# Patient Record
Sex: Female | Born: 1965
Health system: Southern US, Community
[De-identification: ages and names within clinical notes are randomized; demographics above are authoritative.]

## PROBLEM LIST (undated history)

## (undated) DIAGNOSIS — I10 Essential (primary) hypertension: Secondary | ICD-10-CM

## (undated) DIAGNOSIS — T7840XA Allergy, unspecified, initial encounter: Secondary | ICD-10-CM

## (undated) DIAGNOSIS — I7300 Raynaud's syndrome without gangrene: Secondary | ICD-10-CM

## (undated) DIAGNOSIS — C44310 Basal cell carcinoma of skin of unspecified parts of face: Secondary | ICD-10-CM

## (undated) DIAGNOSIS — J342 Deviated nasal septum: Secondary | ICD-10-CM

## (undated) DIAGNOSIS — M2240 Chondromalacia patellae, unspecified knee: Secondary | ICD-10-CM

## (undated) DIAGNOSIS — J309 Allergic rhinitis, unspecified: Secondary | ICD-10-CM

## (undated) DIAGNOSIS — G4730 Sleep apnea, unspecified: Secondary | ICD-10-CM

## (undated) DIAGNOSIS — E559 Vitamin D deficiency, unspecified: Secondary | ICD-10-CM

## (undated) DIAGNOSIS — M7710 Lateral epicondylitis, unspecified elbow: Secondary | ICD-10-CM

## (undated) HISTORY — DX: Vitamin D deficiency, unspecified: E55.9

## (undated) HISTORY — DX: Deviated nasal septum: J34.2

## (undated) HISTORY — DX: Allergic rhinitis, unspecified: J30.9

## (undated) HISTORY — DX: Basal cell carcinoma of skin of unspecified parts of face: C44.310

## (undated) HISTORY — PX: OTHER SURGICAL HISTORY: SHX169

## (undated) HISTORY — DX: Allergy, unspecified, initial encounter: T78.40XA

## (undated) HISTORY — DX: Lateral epicondylitis, unspecified elbow: M77.10

## (undated) HISTORY — DX: Raynaud's syndrome without gangrene: I73.00

## (undated) HISTORY — DX: Chondromalacia patellae, unspecified knee: M22.40

## (undated) HISTORY — DX: Sleep apnea, unspecified: G47.30

---

## 1997-08-06 ENCOUNTER — Other Ambulatory Visit: Admission: RE | Admit: 1997-08-06 | Discharge: 1997-08-06 | Payer: Self-pay | Admitting: Obstetrics and Gynecology

## 1998-11-03 ENCOUNTER — Other Ambulatory Visit: Admission: RE | Admit: 1998-11-03 | Discharge: 1998-11-03 | Payer: Self-pay | Admitting: Obstetrics & Gynecology

## 1999-11-03 ENCOUNTER — Other Ambulatory Visit: Admission: RE | Admit: 1999-11-03 | Discharge: 1999-11-03 | Payer: Self-pay | Admitting: Obstetrics & Gynecology

## 2000-07-03 ENCOUNTER — Inpatient Hospital Stay (HOSPITAL_COMMUNITY): Admission: AD | Admit: 2000-07-03 | Discharge: 2000-07-06 | Payer: Self-pay | Admitting: Obstetrics & Gynecology

## 2000-08-08 ENCOUNTER — Other Ambulatory Visit: Admission: RE | Admit: 2000-08-08 | Discharge: 2000-08-08 | Payer: Self-pay | Admitting: Obstetrics & Gynecology

## 2000-12-11 ENCOUNTER — Encounter: Admission: RE | Admit: 2000-12-11 | Discharge: 2000-12-11 | Payer: Self-pay | Admitting: Obstetrics & Gynecology

## 2000-12-11 ENCOUNTER — Encounter: Payer: Self-pay | Admitting: Obstetrics & Gynecology

## 2001-04-15 ENCOUNTER — Encounter: Admission: RE | Admit: 2001-04-15 | Discharge: 2001-04-15 | Payer: Self-pay | Admitting: Obstetrics & Gynecology

## 2001-04-15 ENCOUNTER — Encounter: Payer: Self-pay | Admitting: Obstetrics & Gynecology

## 2001-10-15 ENCOUNTER — Other Ambulatory Visit: Admission: RE | Admit: 2001-10-15 | Discharge: 2001-10-15 | Payer: Self-pay | Admitting: Obstetrics & Gynecology

## 2001-11-05 ENCOUNTER — Encounter: Payer: Self-pay | Admitting: Obstetrics & Gynecology

## 2001-11-05 ENCOUNTER — Encounter: Admission: RE | Admit: 2001-11-05 | Discharge: 2001-11-05 | Payer: Self-pay | Admitting: Obstetrics & Gynecology

## 2002-07-14 ENCOUNTER — Other Ambulatory Visit: Admission: RE | Admit: 2002-07-14 | Discharge: 2002-07-14 | Payer: Self-pay | Admitting: Obstetrics & Gynecology

## 2002-11-06 ENCOUNTER — Encounter: Admission: RE | Admit: 2002-11-06 | Discharge: 2002-11-06 | Payer: Self-pay | Admitting: Obstetrics & Gynecology

## 2005-03-07 ENCOUNTER — Encounter: Admission: RE | Admit: 2005-03-07 | Discharge: 2005-03-07 | Payer: Self-pay | Admitting: Obstetrics & Gynecology

## 2006-03-19 ENCOUNTER — Encounter: Admission: RE | Admit: 2006-03-19 | Discharge: 2006-03-19 | Payer: Self-pay | Admitting: Obstetrics & Gynecology

## 2007-03-28 ENCOUNTER — Encounter: Admission: RE | Admit: 2007-03-28 | Discharge: 2007-03-28 | Payer: Self-pay | Admitting: Obstetrics & Gynecology

## 2007-09-13 ENCOUNTER — Encounter: Admission: RE | Admit: 2007-09-13 | Discharge: 2007-09-13 | Payer: Self-pay | Admitting: Obstetrics & Gynecology

## 2008-08-07 ENCOUNTER — Encounter: Admission: RE | Admit: 2008-08-07 | Discharge: 2008-08-07 | Payer: Self-pay | Admitting: Obstetrics & Gynecology

## 2009-10-12 ENCOUNTER — Encounter: Admission: RE | Admit: 2009-10-12 | Discharge: 2009-10-12 | Payer: Self-pay | Admitting: Obstetrics & Gynecology

## 2010-03-03 DIAGNOSIS — E559 Vitamin D deficiency, unspecified: Secondary | ICD-10-CM

## 2010-03-03 HISTORY — DX: Vitamin D deficiency, unspecified: E55.9

## 2010-03-17 ENCOUNTER — Ambulatory Visit: Payer: Self-pay | Admitting: Family Medicine

## 2010-03-18 ENCOUNTER — Ambulatory Visit (INDEPENDENT_AMBULATORY_CARE_PROVIDER_SITE_OTHER): Payer: BC Managed Care – PPO | Admitting: Family Medicine

## 2010-03-18 DIAGNOSIS — R5383 Other fatigue: Secondary | ICD-10-CM

## 2010-03-18 DIAGNOSIS — M79609 Pain in unspecified limb: Secondary | ICD-10-CM

## 2010-03-18 DIAGNOSIS — Z0000 Encounter for general adult medical examination without abnormal findings: Secondary | ICD-10-CM

## 2010-05-20 NOTE — H&P (Signed)
[facility]  PatientBRESHAY, ILG                        MRN: [id number] Adm. Date:  [id number] Attending:  Lenoard Aden                         History and Physical  CHIEF COMPLAINT:              LGA for induction.  HISTORY OF PRESENT ILLNESS:   A 45 year old white female G1, P0 at [redacted] weeks gestation with estimated fetal weight 9 pounds.  PAST MEDICAL HISTORY:         Noncontributory.  FAMILY HISTORY:               Noncontributory.  ALLERGIES:                    No known drug allergies.  MEDICATIONS:                  Prenatal vitamins.  PREGNANCY COURSE:             Complicated by presumed LGA, but no other complications.  PHYSICAL EXAMINATION  GENERAL:                      Well-developed, well-nourished white female in no apparent distress.  HEENT:                        Normal.  LUNGS:                        Clear.  HEART:                        Regular rate and rhythm.  ABDOMEN:                      Soft, gravid, nontender.  Estimated fetal weight by ultrasound 9.5 pounds.  PELVIC:                       Cervix:  Closed.  2..5-3 cm, long, vertex, -1/-2.  EXTREMITIES:                  No cords.  NEUROLOGIC:                   Nonfocal.  IMPRESSION:                   A 40 week intrauterine pregnancy with presumed large for gestational age.  PLAN:                         Cervical ripening and induction.  Reassuring fetal heart rate surveillance noted today.DD:  07/04/00 TD:  07/04/00 Job: [id number]

## 2010-05-20 NOTE — H&P (Signed)
[facility]  PatientMAYLENE, [name]                        MRN: [id number] Adm. Date:  [id number] Attending:  Lenoard Aden                         History and Physical  DATE OF BIRTH:                December 09, 1965  PATIENT PROFILE:              [name] is a 45 year old G1, last menstrual period September 26, 1999, for an expected date of delivery, July 03, 2000, at 40 weeks and 1 days gestation.  REASON FOR ADMISSION:         Induction for a suspicion of macrosomia.  HISTORY OF PRESENT ILLNESS:   Fetal movements positive.  No regular uterine contractions.  No vaginal bleeding.  No fluid leak.  No PIH symptoms.  By ultrasound, baby was large for gestational age, at the 93rd percentile at 49 weeks.  A repeat ultrasound at 40 weeks showed the 90th percentile, 9+ pounds.  Amniotic fluid index was within normal limits, cephalic presentation.  PAST MEDICAL HISTORY:         Negative.  PAST SURGICAL HISTORY:        Negative.  FAMILY HISTORY:               Noncontributory.  ALLERGIES:                    No known drug allergies.  MEDICATIONS:                  Prenate vitamins.  SOCIAL HISTORY:               Married, nonsmoker.  HISTORY OF PRESENT PREGNANCY:  First trimester was unremarkable.  Labs showed a hemoglobin at 12.5, platelets 315,000, blood group O-positive, Rh-antibodies negative, RPR nonreactive, HBsAg negative, HIV nonreactive, rubella immune, Pap test was within normal limits, gonorrhea and Chlamydia negative.  In the second trimester, patient had a triple test which was within normal limits. Ultrasound review of anatomy at 20+ weeks was within normal limits with a low-lying placenta.  Cervix was 5.4 cm.  A repeat ultrasound was done at 27+ weeks.  Placenta was anterior, normal insertion.  Estimated fetal weight was 89th percentile with normal amniotic fluid index.  One-hour GTT was normal at 101.  Hemoglobin was 11.   Ultrasounds were done to follow interval growth in the third trimester.  Group B strep at 35+ weeks was negative.  Blood pressures remained normal throughout pregnancy.  REVIEW OF SYSTEMS:            CONSTITUTIONAL:  Negative.  HEENT:  Negative. RESPIRATORY:  Negative.  CARDIOVASCULAR:  Negative.  GI:  Negative.  UROLOGIC: Negative.  DERMATOLOGIC:  Negative.  ENDOCRINE:  Negative.  NEUROLOGIC: Negative.  PHYSICAL EXAMINATION:  GENERAL:                      Patient is 6+ feet tall.  VITAL SIGNS:                  Blood pressure 130/84, pulse 85, respiratory rate 18, temperature 97.1.  Second blood pressure was 111/68.  LUNGS:  Clear bilaterally.  HEART:                        Regular cardiac rhythm.  No murmur.  ABDOMEN:                      Gravid with cephalic presentation.  No regular uterine contractions.  Monitoring:  Babys heart rate 135 to 150 with good variability, no decelerations.  NST reactive.  PELVIC:                       Vaginal exam on arrival:  Cervix was closed and posterior, vertex, membranes intact.  EXTREMITIES:                  Lower limbs:  Mild edema.  No clonus.  IMPRESSION:                   Gravida 1, 40 weeks and 1 days gestation with suspicion of macrosomia, nonfavorable cervix, group B streptococcus negative, fetal well-being reassuring.  PLAN:                         Admit to labor and delivery, induction with Cervidil, then Pitocin, monitoring, expectant management towards probably vaginal delivery. DD:  07/04/00 TD:  07/04/00 Job: [id number]

## 2010-09-08 ENCOUNTER — Other Ambulatory Visit: Payer: Self-pay | Admitting: Obstetrics & Gynecology

## 2010-09-08 DIAGNOSIS — Z1231 Encounter for screening mammogram for malignant neoplasm of breast: Secondary | ICD-10-CM

## 2010-10-14 ENCOUNTER — Ambulatory Visit
Admission: RE | Admit: 2010-10-14 | Discharge: 2010-10-14 | Disposition: A | Discharge status: Home / Self Care | Payer: BC Managed Care – PPO | Source: Ambulatory Visit | Attending: Obstetrics & Gynecology | Admitting: Obstetrics & Gynecology

## 2010-10-14 DIAGNOSIS — Z1231 Encounter for screening mammogram for malignant neoplasm of breast: Secondary | ICD-10-CM

## 2011-11-03 ENCOUNTER — Other Ambulatory Visit: Payer: Self-pay | Admitting: Obstetrics & Gynecology

## 2011-11-03 DIAGNOSIS — Z1231 Encounter for screening mammogram for malignant neoplasm of breast: Secondary | ICD-10-CM

## 2011-11-16 ENCOUNTER — Ambulatory Visit
Admission: RE | Admit: 2011-11-16 | Discharge: 2011-11-16 | Disposition: A | Discharge status: Home / Self Care | Payer: BC Managed Care – PPO | Source: Ambulatory Visit | Attending: Obstetrics & Gynecology | Admitting: Obstetrics & Gynecology

## 2011-11-16 DIAGNOSIS — Z1231 Encounter for screening mammogram for malignant neoplasm of breast: Secondary | ICD-10-CM

## 2012-02-14 ENCOUNTER — Encounter: Payer: Self-pay | Admitting: Internal Medicine

## 2012-02-21 ENCOUNTER — Ambulatory Visit (INDEPENDENT_AMBULATORY_CARE_PROVIDER_SITE_OTHER): Payer: BC Managed Care – PPO | Admitting: Family Medicine

## 2012-02-21 ENCOUNTER — Encounter: Payer: Self-pay | Admitting: Family Medicine

## 2012-02-21 VITALS — BP 122/82 | HR 64 | Ht 72.75 in | Wt 176.0 lb

## 2012-02-21 DIAGNOSIS — R5381 Other malaise: Secondary | ICD-10-CM

## 2012-02-21 DIAGNOSIS — Z0000 Encounter for general adult medical examination without abnormal findings: Secondary | ICD-10-CM

## 2012-02-21 DIAGNOSIS — E559 Vitamin D deficiency, unspecified: Secondary | ICD-10-CM | POA: Insufficient documentation

## 2012-02-21 LAB — CBC WITH DIFFERENTIAL/PLATELET
Basophils Absolute: 0 10*3/uL (ref 0.0–0.1)
Basophils Relative: 0 % (ref 0–1)
Hemoglobin: 13.2 g/dL (ref 12.0–15.0)
Lymphocytes Relative: 23 % (ref 12–46)
MCHC: 33.9 g/dL (ref 30.0–36.0)
Monocytes Relative: 5 % (ref 3–12)
Neutro Abs: 5.2 10*3/uL (ref 1.7–7.7)
Neutrophils Relative %: 68 % (ref 43–77)
WBC: 7.6 10*3/uL (ref 4.0–10.5)

## 2012-02-21 LAB — COMPREHENSIVE METABOLIC PANEL
AST: 27 U/L (ref 0–37)
Albumin: 4.4 g/dL (ref 3.5–5.2)
BUN: 11 mg/dL (ref 6–23)
CO2: 25 mEq/L (ref 19–32)
Calcium: 9 mg/dL (ref 8.4–10.5)
Chloride: 104 mEq/L (ref 96–112)
Creat: 0.72 mg/dL (ref 0.50–1.10)
Glucose, Bld: 75 mg/dL (ref 70–99)
Potassium: 4.1 mEq/L (ref 3.5–5.3)

## 2012-02-21 LAB — POCT URINALYSIS DIPSTICK
Glucose, UA: NEGATIVE
Leukocytes, UA: NEGATIVE
Nitrite, UA: NEGATIVE
Protein, UA: NEGATIVE
Urobilinogen, UA: NEGATIVE

## 2012-02-21 LAB — LIPID PANEL
Cholesterol: 200 mg/dL (ref 0–200)
Triglycerides: 113 mg/dL (ref <150)
VLDL: 23 mg/dL (ref 0–40)

## 2012-02-21 NOTE — Patient Instructions (Signed)

## 2012-02-21 NOTE — Progress Notes (Signed)
Chief Complaint  Patient presents with  . Annual Exam    fasting annual exam, no pap-sees Dr.Lavoie and is up to date. Is concerned anout her triglycerides. Would like to talk about recommended supplements. Also discuss snoring. And would like to be checked for diabetes as her mother has been diagnosed.   [name] is a 47 y.o. female who presents for a complete physical.  She has the following concerns:  Had been eating poorly x 6 months, likes sweets.  Eating better now.  She would like to have sugar and lipids checked. +snoring. Husband denies sleep apnea, just noticing that she is snoring more frequently.  She has some chronic nasal congestion.  Snoring was worse on Sunday after cleaning house, when allergies were worse.  She has known allergies, and takes diphenhydramine occasionally at night.  Denies daytime somnolence or feeling unrefreshed in morning.  Health Maintenance: There is no immunization history on file for this patient. Last TdaP was through Dr. Sharol Roussel office, she believes in 2007 or 2008.  She will try to get record/date. Doesn't get flu shots Last Pap smear: last year with Dr. Seymour Bars Last mammogram: 11/16/11 Last colonoscopy: never Last DEXA: never Dentist: twice yearly Ophtho: every 2 years Exercise:  Stationery bike and walking 30-45 min 3x/week--just starting back after having not exercised much and eating poorly.  Doing better now.  Past Medical History  Diagnosis Date  . Allergic rhinitis, cause unspecified     dust, pollen, dogs  . BCC (basal cell carcinoma), face     x2.  Dr. Nicholas Lose  . Vitamin D deficiency 03/2010    Past Surgical History  Procedure Laterality Date  . Bcc- left face      x2 (left face)--Dr. Nicholas Lose    History   Social History  . Marital Status: Married    Spouse Name: N/A    Number of Children: 1  . Years of Education: N/A   Occupational History  . homemaker    Social History Main Topics  . Smoking status: Never Smoker    . Smokeless tobacco: Never Used  . Alcohol Use: No  . Drug Use: No  . Sexually Active: Yes -- Female partner(s)    Birth Control/ Protection: Pill   Other Topics Concern  . Not on file   Social History Narrative   Married, 1 son.  Homemaker.  homeschooling    Family History  Problem Relation Age of Onset  . Hyperthyroidism Mother   . Glaucoma Mother   . Diabetes Mother   . Basal cell carcinoma Father   . Multiple sclerosis Father   . Irritable bowel syndrome Brother   . Diabetes Cousin   . Cancer Paternal Grandfather     melanoma  . Melanoma Paternal Grandfather     Current outpatient prescriptions:acetaminophen (TYLENOL) 325 MG tablet, Take 650 mg by mouth as needed for pain., Disp: , Rfl: ;  norethindrone-ethinyl estradiol (JUNEL FE,GILDESS FE,LOESTRIN FE) 1-20 MG-MCG tablet, Take 1 tablet by mouth daily., Disp: , Rfl: ;  Pseudoephedrine HCl (SUDAFED PO), Take 1 tablet by mouth as needed., Disp: , Rfl:  diphenhydrAMINE (BENADRYL) 25 MG tablet, Take 25 mg by mouth every 6 (six) hours as needed for itching., Disp: , Rfl: ;  ibuprofen (ADVIL,MOTRIN) 200 MG tablet, Take 200-400 mg by mouth daily., Disp: , Rfl:   No Known Allergies  ROS:  The patient denies anorexia, fever, headaches (+HA when she goes off the pill/hormonal),  vision changes, decreased hearing, ear  pain, sore throat, breast concerns, chest pain, palpitations, dizziness, syncope, dyspnea on exertion, cough, swelling, nausea, vomiting, diarrhea, constipation, abdominal pain, melena, hematochezia, indigestion/heartburn, hematuria, incontinence, dysuria, irregular menstrual cycles, vaginal discharge, odor or itch, genital lesions, numbness, tingling, weakness, tremor, suspicious skin lesions, depression, anxiety, abnormal bleeding/bruising, or enlarged lymph nodes. +weight gain recently  Knee pain (chronic, "runner's knee"), has seen ortho in past.  Has had PT. She and her dad both have Raynaud's.  Doesn't bother her  frequently, just sometimes in cold weather.  PHYSICAL EXAM: BP 122/82  Pulse 64  Ht 6' 0.75" (1.848 m)  Wt 176 lb (79.833 kg)  BMI 23.38 kg/m2  LMP 02/09/2012  General Appearance:    Alert, cooperative, no distress, appears stated age  Head:    Normocephalic, without obvious abnormality, atraumatic  Eyes:    PERRL, conjunctiva/corneas clear, EOM's intact, fundi    benign  Ears:    Normal TM's and external ear canals  Nose:   Nares normal, no drainage or sinus tenderness. Nasal mucosa moderately edematous, pale  Throat:   Lips, mucosa, and tongue normal; teeth and gums normal  Neck:   Supple, no lymphadenopathy;  thyroid:  no   enlargement/tenderness/nodules; no carotid   bruit or JVD  Back:    Spine nontender, no curvature, ROM normal, no CVA     tenderness  Lungs:     Clear to auscultation bilaterally without wheezes, rales or     ronchi; respirations unlabored  Chest Wall:    No tenderness or deformity   Heart:    Regular rate and rhythm, S1 and S2 normal, no murmur, rub   or gallop  Breast Exam:    Deferred to GYN  Abdomen:     Soft, non-tender, nondistended, normoactive bowel sounds,    no masses, no hepatosplenomegaly  Genitalia:    Deferred to GYN     Extremities:   No clubbing, cyanosis or edema  Pulses:   2+ and symmetric all extremities  Skin:   Skin color, texture, turgor normal, no rashes or lesions  Lymph nodes:   Cervical, supraclavicular, and axillary nodes normal  Neurologic:   CNII-XII intact, normal strength, sensation and gait; reflexes 2+ and symmetric throughout          Psych:   Normal mood, affect, hygiene and grooming.    ASSESSMENT/PLAN:  Routine general medical examination at a health care facility - Plan: Visual acuity screening, POCT Urinalysis Dipstick, Lipid panel, Comprehensive metabolic panel, CBC with Differential, TSH  Dyslipidemia - Plan: Lipid panel  Other malaise and fatigue - Plan: Comprehensive metabolic panel, CBC with Differential,  Vitamin D 25 hydroxy, TSH  Unspecified vitamin D deficiency - Plan: Vitamin D 25 hydroxy  Snoring  Snoring--discussed treating allergies, nasal saline, Breathe right strips.  Not interested in trying nasal steroids.  Allergies--discussed treatments, including nonsedating antihistamines, decongestants and the combinations.  Discussed nasal steroids--declines.  Allergy avoidance  Discussed monthly self breast exams and yearly mammograms after the age of 22; at least 30 minutes of aerobic activity at least 5 days/week; proper sunscreen use reviewed; healthy diet, including goals of calcium and vitamin D intake and alcohol recommendations (less than or equal to 1 drink/day) reviewed; regular seatbelt use; changing batteries in smoke detectors.  Immunization recommendations discussed (likely UTD, await records).  Colonoscopy recommendations reviewed--age 71

## 2012-02-22 ENCOUNTER — Encounter: Payer: Self-pay | Admitting: Family Medicine

## 2012-02-22 LAB — VITAMIN D 25 HYDROXY (VIT D DEFICIENCY, FRACTURES): Vit D, 25-Hydroxy: 24 ng/mL — ABNORMAL LOW (ref 30–89)

## 2012-10-17 ENCOUNTER — Other Ambulatory Visit: Payer: Self-pay

## 2012-10-17 DIAGNOSIS — Z1231 Encounter for screening mammogram for malignant neoplasm of breast: Secondary | ICD-10-CM

## 2012-11-19 ENCOUNTER — Ambulatory Visit
Admission: RE | Admit: 2012-11-19 | Discharge: 2012-11-19 | Disposition: A | Discharge status: Home / Self Care | Payer: BC Managed Care – PPO | Source: Ambulatory Visit

## 2012-11-19 DIAGNOSIS — Z1231 Encounter for screening mammogram for malignant neoplasm of breast: Secondary | ICD-10-CM

## 2013-02-26 ENCOUNTER — Ambulatory Visit (INDEPENDENT_AMBULATORY_CARE_PROVIDER_SITE_OTHER): Payer: BC Managed Care – PPO | Admitting: Family Medicine

## 2013-02-26 ENCOUNTER — Encounter: Payer: Self-pay | Admitting: Family Medicine

## 2013-02-26 VITALS — BP 122/84 | HR 68 | Ht 72.0 in | Wt 188.0 lb

## 2013-02-26 DIAGNOSIS — E559 Vitamin D deficiency, unspecified: Secondary | ICD-10-CM

## 2013-02-26 DIAGNOSIS — R0989 Other specified symptoms and signs involving the circulatory and respiratory systems: Secondary | ICD-10-CM

## 2013-02-26 DIAGNOSIS — R5383 Other fatigue: Secondary | ICD-10-CM

## 2013-02-26 DIAGNOSIS — J309 Allergic rhinitis, unspecified: Secondary | ICD-10-CM | POA: Insufficient documentation

## 2013-02-26 DIAGNOSIS — R0683 Snoring: Secondary | ICD-10-CM

## 2013-02-26 DIAGNOSIS — R0609 Other forms of dyspnea: Secondary | ICD-10-CM

## 2013-02-26 DIAGNOSIS — Z0000 Encounter for general adult medical examination without abnormal findings: Secondary | ICD-10-CM

## 2013-02-26 DIAGNOSIS — E782 Mixed hyperlipidemia: Secondary | ICD-10-CM

## 2013-02-26 DIAGNOSIS — R5381 Other malaise: Secondary | ICD-10-CM

## 2013-02-26 DIAGNOSIS — R635 Abnormal weight gain: Secondary | ICD-10-CM

## 2013-02-26 LAB — POCT URINALYSIS DIPSTICK
Bilirubin, UA: NEGATIVE
GLUCOSE UA: NEGATIVE
Ketones, UA: NEGATIVE
Leukocytes, UA: NEGATIVE
NITRITE UA: NEGATIVE
PH UA: 5
Protein, UA: NEGATIVE
Spec Grav, UA: 1.015
UROBILINOGEN UA: NEGATIVE

## 2013-02-26 LAB — LIPID PANEL
CHOL/HDL RATIO: 3.5 ratio
Cholesterol: 194 mg/dL (ref 0–200)
HDL: 55 mg/dL (ref >39)
LDL CALC: 117 mg/dL — AB (ref 0–99)
Triglycerides: 112 mg/dL (ref <150)
VLDL: 22 mg/dL (ref 0–40)

## 2013-02-26 LAB — TSH: TSH: 1.584 u[IU]/mL (ref 0.350–4.500)

## 2013-02-26 LAB — GLUCOSE, RANDOM: Glucose, Bld: 79 mg/dL (ref 70–99)

## 2013-02-26 MED ORDER — FLUTICASONE PROPIONATE 50 MCG/ACT NA SUSP: 2.0000 | Freq: Every day | NASAL | Status: DC

## 2013-02-26 NOTE — Patient Instructions (Addendum)
  HEALTH MAINTENANCE RECOMMENDATIONS:  It is recommended that you get at least 30 minutes of aerobic exercise at least 5 days/week (for weight loss, you may need as much as 60-90 minutes). This can be any activity that gets your heart rate up. This can be divided in 10-15 minute intervals if needed, but try and build up your endurance at least once a week.  Weight bearing exercise is also recommended twice weekly.  Eat a healthy diet with lots of vegetables, fruits and fiber.  "Colorful" foods have a lot of vitamins (ie green vegetables, tomatoes, red peppers, etc).  Limit sweet tea, regular sodas and alcoholic beverages, all of which has a lot of calories and sugar.  Up to 1 alcoholic drink daily may be beneficial for women (unless trying to lose weight, watch sugars).  Drink a lot of water.  Calcium recommendations are 1200-1500 mg daily (1500 mg for postmenopausal women or women without ovaries), and vitamin D 1000 IU daily.  This should be obtained from diet and/or supplements (vitamins), and calcium should not be taken all at once, but in divided doses.  Monthly self breast exams and yearly mammograms for women over the age of 3 is recommended.  Sunscreen of at least SPF 30 should be used on all sun-exposed parts of the skin when outside between the hours of 10 am and 4 pm (not just when at beach or pool, but even with exercise, golf, tennis, and yard work!)  Use a sunscreen that says "broad spectrum" so it covers both UVA and UVB rays, and make sure to reapply every 1-2 hours.  Remember to change the batteries in your smoke detectors when changing your clock times in the spring and fall.  Use your seat belt every time you are in a car, and please drive safely and not be distracted with cell phones and texting while driving.  Start the flonase today.  Gentle sniffs.  It may take up to two weeks to see the full effect. You may use claritin/zyrtec/allegra along with it, if needed (one of those  antihistamines, not all)

## 2013-02-26 NOTE — Progress Notes (Signed)
Chief Complaint  Patient presents with  . Annual Exam    fasting annual exam, no pap-sees Dr.Lavoie and is UTD. She did want to have her cholesterol and trigs, sugar and maybe tsh checked. Did not di eye exam as she just had one. Would like to discuss her snoring issues, weight gain and also discuss vitamins and supplements. UA show 3+, pt is spotting.    [name] is a 48 y.o. female who presents for a complete physical.  She has the following concerns:  +snoring. Husband denies sleep apnea. She has some chronic nasal congestion, feels a little stuffed up all the time. Takes OTC sinus and allergy medications just prn.  She is no longer able to wake up without her alarm (like she has always done in the past).  She gets a mid-afternoon "lull" where she feels sleepy.  She gets about 7 hours of sleep.  Other than the alarm waking her up, and the adjustment, she feels refreshed in the morning.  She will fall asleep easily in the afternoons.  Admits to not getting any regular exercise.  She has many questions re: vitamins, supplements, diet, fish oil, all of which were addressed.  Immunization History  Administered Date(s) Administered  . Influenza Split 09/02/2012   Last TdaP was through Dr. Assunta Curtis office, she believes in 2007 or 2008. She checked, and recalls that it was definitely TdaP, but doesn't recall the date. Last Pap smear: per Dr. Dellis Filbert (about 1.5 years ago) Last mammogram: 11/2012 Last colonoscopy: never  Last DEXA: never  Dentist: twice yearly  Ophtho: every 2 years, just got new glasses 12/2012 Exercise: She hasn't been getting any regular exercise She eats yogurt daily and drinks milk daily. She doesn't eat any nuts (son is allergic), doesn't eat fish.  Past Medical History  Diagnosis Date  . Allergic rhinitis, cause unspecified     dust, pollen, dogs  . BCC (basal cell carcinoma), face     x2.  Dr. Ubaldo Glassing  . Vitamin D deficiency 03/2010  . Raynaud disease   .  Runner's knee   . Tennis elbow     right   Past Surgical History  Procedure Laterality Date  . Bcc- left face      x2 (left face)--Dr. Ubaldo Glassing   History   Social History  . Marital Status: Married    Spouse Name: N/A    Number of Children: 1  . Years of Education: N/A   Occupational History  . homemaker    Social History Main Topics  . Smoking status: Never Smoker   . Smokeless tobacco: Never Used  . Alcohol Use: No  . Drug Use: No  . Sexual Activity: Yes    Partners: Male    Birth Control/ Protection: Pill   Other Topics Concern  . 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 Hyperthyroidism Mother   . Glaucoma Mother   . Diabetes Mother   . Basal cell carcinoma Father   . Multiple sclerosis Father   . Irritable bowel syndrome Brother   . Diabetes Cousin   . Cancer Paternal Grandfather     melanoma  . Melanoma Paternal Grandfather    Current Outpatient Prescriptions on File Prior to Visit  Medication Sig Dispense Refill  . norethindrone-ethinyl estradiol (JUNEL FE,GILDESS FE,LOESTRIN FE) 1-20 MG-MCG tablet Take 1 tablet by mouth daily.      Marland Kitchen  acetaminophen (TYLENOL) 325 MG tablet Take 650 mg by mouth as needed for pain.      . diphenhydrAMINE (BENADRYL) 25 MG tablet Take 25 mg by mouth every 6 (six) hours as needed for itching.      Marland Kitchen ibuprofen (ADVIL,MOTRIN) 200 MG tablet Take 200-400 mg by mouth daily.      . Pseudoephedrine HCl (SUDAFED PO) Take 1 tablet by mouth as needed.       No current facility-administered medications on file prior to visit.   Not taking any meds currently except for OCP's--others are just prn.  No Known Allergies  ROS: The patient denies anorexia, fever, headaches, vision changes, decreased hearing, ear pain, sore throat, breast concerns, chest pain, palpitations, dizziness, syncope, dyspnea on exertion, cough, swelling, nausea, vomiting,  diarrhea, constipation, abdominal pain, melena, hematochezia, indigestion/heartburn, hematuria, incontinence, dysuria, irregular menstrual cycles, vaginal discharge, odor or itch, genital lesions, numbness, tingling, weakness, tremor, suspicious skin lesions, depression, anxiety, abnormal bleeding/bruising, or enlarged lymph nodes.  Only rare knee pain (h/o  "runner's knee", has seen ortho in past. Has had PT.)  R tennis elbow, got PT recently and symptoms mostly resolved.  Uses band prn. She and her dad both have Raynaud's. Doesn't bother her frequently, just sometimes in cold weather.  Not as bad as it used to be. Chronic congestion/allergies. Occasionally notices a pause in her heartbeat--infrequent Gained 12 pounds since her last physical, up 18 total since 2012.  PHYSICAL EXAM: BP 122/84  Pulse 68  Ht 6' (1.829 m)  Wt 188 lb (85.276 kg)  BMI 25.49 kg/m2  LMP 02/05/2013  General Appearance:  Alert, cooperative, no distress, appears stated age   Head:  Normocephalic, without obvious abnormality, atraumatic   Eyes:  PERRL, conjunctiva/corneas clear, EOM's intact, fundi  benign   Ears:  Normal TM's and external ear canals   Nose:  Nares normal, no drainage or sinus tenderness. Nasal mucosa mod-severely edematous, pale   Throat:  Lips, mucosa, and tongue normal; teeth and gums normal   Neck:  Supple, no lymphadenopathy; thyroid: no enlargement/tenderness/nodules; no carotid  bruit or JVD   Back:  Spine nontender, no curvature, ROM normal, no CVA tenderness   Lungs:  Clear to auscultation bilaterally without wheezes, rales or ronchi; respirations unlabored   Chest Wall:  No tenderness or deformity   Heart:  Regular rate and rhythm, S1 and S2 normal, no murmur, rub  or gallop   Breast Exam:  Deferred to GYN   Abdomen:  Soft, non-tender, nondistended, normoactive bowel sounds,  no masses, no hepatosplenomegaly   Genitalia:  Deferred to GYN      Extremities:  No clubbing, cyanosis or  edema   Pulses:  2+ and symmetric all extremities   Skin:  Skin color, texture, turgor normal, no rashes or lesions   Lymph nodes:  Cervical, supraclavicular, and axillary nodes normal   Neurologic:  CNII-XII intact, normal strength, sensation and gait; reflexes 2+ and symmetric throughout          Psych: Normal mood, affect, hygiene and grooming.   Urine dip 3+ blood (on period now).  ASSESSMENT/PLAN:  Routine general medical examination at a health care facility - Plan: POCT Urinalysis Dipstick, Glucose, random, Lipid panel  Snoring - likely related to allergies and nasal edema.  Trial of Flonase  Unspecified vitamin D deficiency - hasn't been taking supplements.  may need Rx.  discussed need for longterm supplementation (if inadequate from diet alone) - Plan: Vit D  25 hydroxy (  rtn osteoporosis monitoring)  Other malaise and fatigue - Plan: TSH, Vit D  25 hydroxy (rtn osteoporosis monitoring)  Weight gain - counseled at length re: diet, portions, exercise - Plan: TSH  Elevated cholesterol with elevated triglycerides - h/o elevated TG in past, normal on last check.  Pt asking for recheck - Plan: Lipid panel  Allergic rhinitis, cause unspecified - discussed proper use, risks and side effects of nasal steroids - Plan: fluticasone (FLONASE) 50 MCG/ACT nasal spray  Discussed monthly self breast exams and yearly mammograms after the age of 83; at least 30 minutes of aerobic activity at least 5 days/week; proper sunscreen use reviewed; healthy diet, including goals of calcium and vitamin D intake and alcohol recommendations (less than or equal to 1 drink/day) reviewed; regular seatbelt use; changing batteries in smoke detectors.  Immunization recommendations discussed--Tdap is UTD (will try and let us know date), yearly flu shots recommended.  Colonoscopy recommendations reviewed--age 60.

## 2013-02-27 ENCOUNTER — Encounter: Payer: Self-pay | Admitting: Family Medicine

## 2013-02-27 LAB — VITAMIN D 25 HYDROXY (VIT D DEFICIENCY, FRACTURES): Vit D, 25-Hydroxy: 29 ng/mL — ABNORMAL LOW (ref 30–89)

## 2013-09-15 ENCOUNTER — Other Ambulatory Visit: Payer: Self-pay

## 2013-09-15 DIAGNOSIS — Z1231 Encounter for screening mammogram for malignant neoplasm of breast: Secondary | ICD-10-CM

## 2013-10-02 ENCOUNTER — Encounter: Payer: Self-pay | Admitting: Family Medicine

## 2013-10-02 ENCOUNTER — Ambulatory Visit (INDEPENDENT_AMBULATORY_CARE_PROVIDER_SITE_OTHER): Payer: BC Managed Care – PPO | Admitting: Family Medicine

## 2013-10-02 VITALS — BP 130/90 | HR 80 | Ht 72.0 in | Wt 187.0 lb

## 2013-10-02 DIAGNOSIS — F4322 Adjustment disorder with anxiety: Secondary | ICD-10-CM

## 2013-10-02 DIAGNOSIS — J302 Other seasonal allergic rhinitis: Secondary | ICD-10-CM

## 2013-10-02 DIAGNOSIS — Z23 Encounter for immunization: Secondary | ICD-10-CM

## 2013-10-02 NOTE — Progress Notes (Signed)
Chief Complaint  Patient presents with  . Advice Only    thinks she has some concern for Marfan's syndrome. Has had some cardiac symptoms, also heaviness in her limbs.    She learned about Marfan's when doing a project with her son at school. She thought the pictures looked like her son.  She then learned that he was hypermobile (told by a yoga instructor). She spoke to pediatrician about hypermobility, who didn't suggest Marfan's (esp given lack of family history).  He was then diagnosed with scoliosis (being monitored, not requiring treatment). He also has myopia, with significant vision change over the last year (20/60 to 20/300).  Eye doctor mentioned Marfan's.  She has been researching this and is very concerned.  He also has stretch marks (behind knees--he had rapid growth over the last year).  Son was referred to cardiologist and genetics.  11/10 is appt with cardiologist.  During this time, she started having a lot of anxiety, as each new thing makes her more concerned about her son having Marfan's.  She then wonders if maybe she has it too, given that she is also tall, has scoliosis, and had stretch marks when she was his age.  She has done a lot of reading on this. She has positive wrist sign, but not thumb sign.  She notices skipped beats--ongoing x years.  Isn't very frequent, but a little more recently.  Can skip days/weeks/months.  She has noticed it more lately.  She also had some dizziness and fatigue.  She has also had a slight heaviness in her chest and arms--not exertional, and can last a few days.  Allergies are flaring now, and is using Benadryl (which makes her drowsy).  Chest symptoms feel different than usual congestion.  She had a dizzy spell yesterday.  Feels more like equilibrium is off, slightly woozy, rather than a true light-headedness (gets occasionall also).  She is here today due to anxiety, and then wondering if she could potentially have Marfan's as well. Son just  started school (had been home schooled until now).  Also found out he was color blind, has peanut and environmental allergies.  Past Medical History  Diagnosis Date  . Allergic rhinitis, cause unspecified     dust, pollen, dogs  . BCC (basal cell carcinoma), face     x2.  Dr. Ubaldo Glassing  . Vitamin D deficiency 03/2010  . Raynaud disease   . Runner's knee   . Tennis elbow     right   Past Surgical History  Procedure Laterality Date  . Bcc- left face      x2 (left face)--Dr. Ubaldo Glassing   History   Social History  . Marital Status: Married    Spouse Name: N/A    Number of Children: 1  . Years of Education: N/A   Occupational History  . homemaker    Social History Main Topics  . Smoking status: Never Smoker   . Smokeless tobacco: Never Used  . Alcohol Use: No  . Drug Use: No  . Sexual Activity: Yes    Partners: Male    Birth Control/ Protection: Pill   Other Topics Concern  . Not on file   Social History Narrative   Married, 1 son.  Homemaker.  homeschooling   Outpatient Encounter Prescriptions as of 10/02/2013  Medication Sig  . norethindrone-ethinyl estradiol (JUNEL FE,GILDESS FE,LOESTRIN FE) 1-20 MG-MCG tablet Take 1 tablet by mouth daily.  Marland Kitchen acetaminophen (TYLENOL) 325 MG tablet Take 650 mg by mouth  as needed for pain.  . diphenhydrAMINE (BENADRYL) 25 MG tablet Take 25 mg by mouth every 6 (six) hours as needed for itching.  . fluticasone (FLONASE) 50 MCG/ACT nasal spray Place 2 sprays into both nostrils daily.  Marland Kitchen ibuprofen (ADVIL,MOTRIN) 200 MG tablet Take 200-400 mg by mouth daily.  . Pseudoephedrine HCl (SUDAFED PO) Take 1 tablet by mouth as needed.   No Known Allergies  ROS:  See HPI.  No fevers, chills, cough, shortness of breath.  Chest heaviness and skipped beats as per HPI.  +allergies/congestion, occasional dizziness as per HPI.  Denies nausea, vomiting, abdominal pain, bleeding, bruising, rashes or other concerns except as noted above.  +anxiety.  PHYSICAL  EXAM: BP 130/90  Pulse 80  Ht 6' (1.829 m)  Wt 187 lb (84.823 kg)  BMI 25.36 kg/m2 Well developed, pleasant, mildly anxious female in no distress.  Some sniffling and throat-clearing noted. HEENT:  PERRL, EOMI, conjunctiva clear. Nasal mucosa mod-severely edematous,without erythema or purulence.  Cobblestoning posteriorly, OP otherwise normal Neck: no lymphadenopathy, thyromegaly or mass Heart: regular rate and rhythm; no ectopy, murmur Lungs: clear bilaterally Abdomen: soft, nontender, no organomegaly or mass Extremities: no edema.  2+ pulses.  +wrist sign, negative thumb sign Psych: normal hygiene, grooming, speech.  +mildly anxious; not depressed Neuro: alert and oriented.  Cranial nerves intact. Normal strength, sensation, gait.  ASSESSMENT/PLAN:  Seasonal allergic rhinitis - likely contributing to some of her symptoms, including fatigue, chest congestion and dizziness, as her sx aren't well controlled  Adjustment reaction with anxiety - counseled re: anxiety, relaxation techniques. 5 weeks until son's cardiac eval, then will know more.  consider counseling  Need for prophylactic vaccination and inoculation against influenza - Plan: Flu Vaccine QUAD 36+ mos PF IM (Fluarix Quad PF)   Allergies are likely contributing to some dizzy/equilibrium symptoms, and possibly chest congestion.  Try using nasal saline, and 15-30 minutes later try using Flonase (2 sprays each nostril daily, just gentle sniffs). Consider re-trying 24 hour antihistamines, and using benadryl as needed (so less sedation). Consider using Singulair for allergies if still very symptomatic. Try Mucinex (guaifenesin) to help with chest congestion and/or thick mucus (in sinuses/throat)  Consider counseling for anxiety if getting worse.  Call us for referral for echocardiogram if your son is diagnosed with Marfan's syndrome.  35 minute visit, more than 1/2 spent counseling

## 2013-10-02 NOTE — Patient Instructions (Signed)
   Allergies are likely contributing to some dizzy/equilibrium symptoms, and possibly chest congestion.  Try using nasal saline, and 15-30 minutes later try using Flonase (2 sprays each nostril daily, just gentle sniffs). Consider re-trying 24 hour antihistamines, and using benadryl as needed (so less sedation). Consider using Singulair for allergies if still very symptomatic. Try Mucinex (guaifenesin) to help with chest congestion and/or thick mucus (in sinuses/throat)  Consider counseling for anxiety if getting worse.  Call us for referral for echocardiogram if your son is diagnosed with Marfan's syndrome.   Dr. Almedia Balls is the doctor I recommend for scoliosis. (for your son)

## 2013-10-03 ENCOUNTER — Encounter: Payer: Self-pay | Admitting: Family Medicine

## 2013-11-03 ENCOUNTER — Encounter: Payer: Self-pay | Admitting: Family Medicine

## 2013-11-20 ENCOUNTER — Ambulatory Visit
Admission: RE | Admit: 2013-11-20 | Discharge: 2013-11-20 | Disposition: A | Discharge status: Home / Self Care | Payer: BC Managed Care – PPO | Source: Ambulatory Visit

## 2013-11-20 DIAGNOSIS — Z1231 Encounter for screening mammogram for malignant neoplasm of breast: Secondary | ICD-10-CM

## 2014-02-18 ENCOUNTER — Ambulatory Visit (INDEPENDENT_AMBULATORY_CARE_PROVIDER_SITE_OTHER): Payer: 59 | Admitting: Family Medicine

## 2014-02-18 ENCOUNTER — Encounter: Payer: Self-pay | Admitting: Family Medicine

## 2014-02-18 VITALS — BP 124/84 | HR 76 | Ht 72.0 in | Wt 180.0 lb

## 2014-02-18 DIAGNOSIS — R5383 Other fatigue: Secondary | ICD-10-CM

## 2014-02-18 DIAGNOSIS — E559 Vitamin D deficiency, unspecified: Secondary | ICD-10-CM

## 2014-02-18 DIAGNOSIS — Z0000 Encounter for general adult medical examination without abnormal findings: Secondary | ICD-10-CM

## 2014-02-18 LAB — POCT URINALYSIS DIPSTICK
BILIRUBIN UA: NEGATIVE
GLUCOSE UA: NEGATIVE
KETONES UA: NEGATIVE
LEUKOCYTES UA: NEGATIVE
NITRITE UA: NEGATIVE
PROTEIN UA: NEGATIVE
RBC UA: NEGATIVE
SPEC GRAV UA: 1.015
Urobilinogen, UA: NEGATIVE
pH, UA: 7.5

## 2014-02-18 NOTE — Patient Instructions (Signed)
  HEALTH MAINTENANCE RECOMMENDATIONS:  It is recommended that you get at least 30 minutes of aerobic exercise at least 5 days/week (for weight loss, you may need as much as 60-90 minutes). This can be any activity that gets your heart rate up. This can be divided in 10-15 minute intervals if needed, but try and build up your endurance at least once a week.  Weight bearing exercise is also recommended twice weekly.  Eat a healthy diet with lots of vegetables, fruits and fiber.  "Colorful" foods have a lot of vitamins (ie green vegetables, tomatoes, red peppers, etc).  Limit sweet tea, regular sodas and alcoholic beverages, all of which has a lot of calories and sugar.  Up to 1 alcoholic drink daily may be beneficial for women (unless trying to lose weight, watch sugars).  Drink a lot of water.  Calcium recommendations are 1200-1500 mg daily (1500 mg for postmenopausal women or women without ovaries), and vitamin D 1000 IU daily.  This should be obtained from diet and/or supplements (vitamins), and calcium should not be taken all at once, but in divided doses.  Monthly self breast exams and yearly mammograms for women over the age of 56 is recommended.  Sunscreen of at least SPF 30 should be used on all sun-exposed parts of the skin when outside between the hours of 10 am and 4 pm (not just when at beach or pool, but even with exercise, golf, tennis, and yard work!)  Use a sunscreen that says "broad spectrum" so it covers both UVA and UVB rays, and make sure to reapply every 1-2 hours.  Remember to change the batteries in your smoke detectors when changing your clock times in the spring and fall.  Use your seat belt every time you are in a car, and please drive safely and not be distracted with cell phones and texting while driving.   Your history doesn't sound like significant sleep apnea is an underlying issue.  I do feel that perhaps you are not getting as much sleep as you need.  Try and aim for  8-9 hours each night, to see if you feel less tired during the day. Let's look into the other reasons for fatigue--ie ensuring that your vitamin D levels are adequately replaced, and other labs (thyroid, blood counts, chem panel, etc).  Epworth Sleepiness Scale If your score is significantly high (higher than I would expect it to be), then let me know, and we can refer you for a sleep study.

## 2014-02-18 NOTE — Progress Notes (Signed)
Chief Complaint  Patient presents with  . Annual Exam    fasting annual exam no pap-sees Dr.Lavoie and is UTD. Would like to discuss the fact that over the last 2 years she gets up twice and sometimes three times at night to urinate. Also would like to revisit sleep apnea.    [name] is a 49 y.o. female who presents for a complete physical.  She has the following concerns:  Patient is still concerned about possible sleep apnea.  She knows that she snores.  In the past, her husband has denied any apnea, but after watching a video of sleep apnea recently, and seeing the lack of "snort/startle" she is concerned that he wouldn't necessarily truly recognize it. She has some chronic nasal congestion, feels a little stuffed up all the time. She tried Flonase--it didn't help with the congestion much, not sure if snoring improved or not.  She continues to need an alarm to wake her up in the morning (feels like she would sleep for another hour without it). She no longer gets an afternoon "lull" like she used to, but is not feeling as refreshed when she wakes up.  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 She is more active during the day now, and doesn't have that lull in the afternoon.  She continues to snore, but isn't sure if it is every night.  If in a lighter sleep (ie dozing on couch) she will notice herself snort/snoring.  Son's eval was okay for Marfan's--upper range of normal, has some traits.  They are planning to follow him yearly until he stops growing.  They are holding off on genetic testing at this point.  Vitamin D deficiency: hasn't been taking any supplements or MVI.  Raynaud's--less frequent overall.  Immunization History  Administered Date(s) Administered  . Influenza Split 09/02/2012  . Influenza,inj,Quad PF,36+ Mos 10/02/2013   Last TdaP was through Dr. Assunta Curtis office, she believes in 2007 or 2008. She checked,  and recalls that it was definitely TdaP, but doesn't recall the date. Last Pap smear: per Dr. Dellis Filbert, UTD Last mammogram: 11/2013 Last colonoscopy: never  Last DEXA: never  Dentist: twice yearly  Ophtho: every 2 years, got new glasses 12/2012 Exercise: walks 2x/week. She eats yogurt daily and drinks milk daily. She doesn't eat any nuts (son is allergic), doesn't eat fish. Lab Results  Component Value Date   CHOL 194 02/26/2013   HDL 55 02/26/2013   LDLCALC 117* 02/26/2013   TRIG 112 02/26/2013   CHOLHDL 3.5 02/26/2013   TSH and glucose normal last year  Past Medical History  Diagnosis Date  . Allergic rhinitis, cause unspecified     dust, pollen, dogs  . BCC (basal cell carcinoma), face     x2.  Dr. Ubaldo Glassing  . Vitamin D deficiency 03/2010  . Raynaud disease   . Runner's knee   . Tennis elbow     right    Past Surgical History  Procedure Laterality Date  . Bcc- left face      x2 (left face)--Dr. Ubaldo Glassing    History   Social History  . Marital Status: Married    Spouse Name: N/A  . Number of Children: 1  . Years of Education: N/A   Occupational History  . homemaker    Social History Main Topics  . Smoking status: Never Smoker   . Smokeless tobacco: Never Used  . Alcohol  Use: No  . Drug Use: No  . Sexual Activity:    Partners: Male    Birth Control/ Protection: Pill   Other Topics Concern  . Not on file   Social History Narrative   Married, 1 son.  Homemaker. Previously homeschooled; son is at [employer] (8th)    Family History  Problem Relation Age of Onset  . Hyperthyroidism Mother   . Glaucoma Mother   . Diabetes Mother   . Basal cell carcinoma Father   . Multiple sclerosis Father   . Irritable bowel syndrome Brother   . Diabetes Cousin   . Cancer Paternal Grandfather     melanoma  . Melanoma Paternal Grandfather     Outpatient Encounter Prescriptions as of 02/18/2014  Medication Sig  . norethindrone-ethinyl estradiol (JUNEL  FE,GILDESS FE,LOESTRIN FE) 1-20 MG-MCG tablet Take 1 tablet by mouth daily.  Marland Kitchen acetaminophen (TYLENOL) 325 MG tablet Take 650 mg by mouth as needed for pain.  . diphenhydrAMINE (BENADRYL) 25 MG tablet Take 25 mg by mouth every 6 (six) hours as needed for itching.  . fluticasone (FLONASE) 50 MCG/ACT nasal spray Place 2 sprays into both nostrils daily. (Patient not taking: Reported on 02/18/2014)  . ibuprofen (ADVIL,MOTRIN) 200 MG tablet Take 200-400 mg by mouth daily.  . Pseudoephedrine HCl (SUDAFED PO) Take 1 tablet by mouth as needed.  Not currently taking any medications other than her birth control pills.  Took some benadryl at night for recent URI symptoms, no sudafed.  No Known Allergies  ROS: The patient denies anorexia, fever, vision changes, decreased hearing, ear pain, sore throat, breast concerns, chest pain, palpitations, dizziness, syncope, dyspnea on exertion, cough, swelling, nausea, vomiting, diarrhea, constipation, abdominal pain, melena, hematochezia, hematuria, incontinence, dysuria, irregular menstrual cycles (takes OCP's continuously), vaginal discharge, odor or itch, genital lesions, numbness, tingling, weakness, tremor, suspicious skin lesions, depression, anxiety, abnormal bleeding/bruising, or enlarged lymph nodes.  R tennis elbow only occasionally flares, better overall. Raynaud's doesn't bother her frequently, just sometimes in cold weather. Not as bad as it used to be. Chronic congestion/allergies. Recent cold symptoms (family sick). She lost 8 pounds since her physical last year. Left sided headaches, related to her cycles (which is why she takes OCP's continuously)--has one x 2 days, thinks her typical hormone-related headache. Occasional heartburn   PHYSICAL EXAM:  BP 146/90 mmHg  Pulse 76  Ht 6' (1.829 m)  Wt 180 lb (81.647 kg)  BMI 24.41 kg/m2 124/84 on repeat by MD General Appearance:  Alert, cooperative, no distress, appears stated age   Head:   Normocephalic, without obvious abnormality, atraumatic   Eyes:  PERRL, conjunctiva/corneas clear, EOM's intact, fundi  benign   Ears:  Normal TM's and external ear canals   Nose:  Nares normal, no drainage or sinus tenderness. Nasal mucosa moderately edematous, pale   Throat:  Lips, mucosa, and tongue normal; teeth and gums normal   Neck:  Supple, no lymphadenopathy; thyroid: no enlargement/tenderness/nodules; no carotid  bruit or JVD   Back:  Spine nontender, no curvature, ROM normal, no CVA tenderness   Lungs:  Clear to auscultation bilaterally without wheezes, rales or ronchi; respirations unlabored   Chest Wall:  No tenderness or deformity   Heart:  Regular rate and rhythm, S1 and S2 normal, no murmur, rub  or gallop   Breast Exam:  Deferred to GYN   Abdomen:  Soft, non-tender, nondistended, normoactive bowel sounds,  no masses, no hepatosplenomegaly   Genitalia:  Deferred to GYN  Extremities:  No clubbing, cyanosis or edema   Pulses:  2+ and symmetric all extremities   Skin:  Skin color, texture, turgor normal, no rashes or lesions   Lymph nodes:  Cervical, supraclavicular, and axillary nodes normal   Neurologic:  CNII-XII intact, normal strength, sensation and gait; reflexes 2+ and symmetric throughout    Psych: Normal mood, affect, hygiene and grooming.         Urine dip normal.   ASSESSMENT/PLAN:  Annual physical exam - Plan: POCT Urinalysis Dipstick, Visual acuity screening, Comprehensive metabolic panel, CBC with Differential/Platelet, Vit D  25 hydroxy (rtn osteoporosis monitoring), TSH  Vitamin D deficiency - noncompliant with supplements - Plan: Vit D  25 hydroxy (rtn osteoporosis monitoring)  Other fatigue - suspect inadequate sleep. Consider sleep study (pt to check with husband, try and get more sleep) - Plan: Comprehensive metabolic panel, CBC with Differential/Platelet, Vit D  25 hydroxy  (rtn osteoporosis monitoring), TSH   Discussed monthly self breast exams and yearly mammograms after the age of 58; at least 30 minutes of aerobic activity at least 5 days/week; proper sunscreen use reviewed; healthy diet, including goals of calcium and vitamin D intake and alcohol recommendations (less than or equal to 1 drink/day) reviewed; regular seatbelt use; changing batteries in smoke detectors. Immunization recommendations discussed--Tdap is UTD per patient; recommend booster next year; yearly flu shots recommended. Colonoscopy recommendations reviewed--age 33.  Your history doesn't sound like significant sleep apnea is an underlying issue.  I do feel that perhaps you are not getting as much sleep as you need.  Try and aim for 8-9 hours each night, to see if you feel less tired during the day. Let's look into the other reasons for fatigue--ie ensuring that your vitamin D levels are adequately replaced, and other labs (thyroid, blood counts, chem panel, etc).  Epworth Sleepiness Scale (pt wants to look up and self-admin) If your score is significantly high (higher than I would expect it to be), then let me know, and we can refer you for a sleep study.

## 2014-02-19 ENCOUNTER — Other Ambulatory Visit: Payer: Self-pay | Admitting: *Deleted

## 2014-02-19 LAB — COMPREHENSIVE METABOLIC PANEL
ALBUMIN: 4.4 g/dL (ref 3.5–5.2)
ALK PHOS: 68 U/L (ref 39–117)
ALT: 22 U/L (ref 0–35)
AST: 22 U/L (ref 0–37)
BILIRUBIN TOTAL: 0.5 mg/dL (ref 0.2–1.2)
BUN: 10 mg/dL (ref 6–23)
CO2: 27 mEq/L (ref 19–32)
Calcium: 9 mg/dL (ref 8.4–10.5)
Chloride: 101 mEq/L (ref 96–112)
Creat: 0.72 mg/dL (ref 0.50–1.10)
GLUCOSE: 78 mg/dL (ref 70–99)
POTASSIUM: 3.8 meq/L (ref 3.5–5.3)
Sodium: 137 mEq/L (ref 135–145)
Total Protein: 6.9 g/dL (ref 6.0–8.3)

## 2014-02-19 LAB — CBC WITH DIFFERENTIAL/PLATELET
Basophils Absolute: 0 10*3/uL (ref 0.0–0.1)
Basophils Relative: 0 % (ref 0–1)
EOS PCT: 3 % (ref 0–5)
Eosinophils Absolute: 0.2 10*3/uL (ref 0.0–0.7)
HEMATOCRIT: 42 % (ref 36.0–46.0)
HEMOGLOBIN: 13.9 g/dL (ref 12.0–15.0)
LYMPHS ABS: 1.6 10*3/uL (ref 0.7–4.0)
LYMPHS PCT: 19 % (ref 12–46)
MCH: 28.8 pg (ref 26.0–34.0)
MCHC: 33.1 g/dL (ref 30.0–36.0)
MCV: 87 fL (ref 78.0–100.0)
MONOS PCT: 6 % (ref 3–12)
MPV: 9.4 fL (ref 8.6–12.4)
Monocytes Absolute: 0.5 10*3/uL (ref 0.1–1.0)
Neutro Abs: 6 10*3/uL (ref 1.7–7.7)
Neutrophils Relative %: 72 % (ref 43–77)
Platelets: 351 10*3/uL (ref 150–400)
RBC: 4.83 MIL/uL (ref 3.87–5.11)
RDW: 13 % (ref 11.5–15.5)
WBC: 8.3 10*3/uL (ref 4.0–10.5)

## 2014-02-19 LAB — TSH: TSH: 1.365 u[IU]/mL (ref 0.350–4.500)

## 2014-02-19 LAB — VITAMIN D 25 HYDROXY (VIT D DEFICIENCY, FRACTURES): VIT D 25 HYDROXY: 15 ng/mL — AB (ref 30–100)

## 2014-02-19 MED ORDER — VITAMIN D (ERGOCALCIFEROL) 1.25 MG (50000 UNIT) PO CAPS: 50000.0000 [IU] | ORAL_CAPSULE | ORAL | Status: DC

## 2014-11-18 ENCOUNTER — Other Ambulatory Visit: Payer: Self-pay

## 2014-11-18 DIAGNOSIS — Z1231 Encounter for screening mammogram for malignant neoplasm of breast: Secondary | ICD-10-CM

## 2014-12-09 ENCOUNTER — Ambulatory Visit
Admission: RE | Admit: 2014-12-09 | Discharge: 2014-12-09 | Disposition: A | Discharge status: Home / Self Care | Payer: 59 | Source: Ambulatory Visit

## 2014-12-09 DIAGNOSIS — Z1231 Encounter for screening mammogram for malignant neoplasm of breast: Secondary | ICD-10-CM

## 2015-03-03 ENCOUNTER — Ambulatory Visit (INDEPENDENT_AMBULATORY_CARE_PROVIDER_SITE_OTHER): Payer: 59 | Admitting: Family Medicine

## 2015-03-03 ENCOUNTER — Encounter: Payer: Self-pay | Admitting: Family Medicine

## 2015-03-03 VITALS — BP 140/88 | HR 80 | Ht 72.0 in | Wt 186.4 lb

## 2015-03-03 DIAGNOSIS — Z23 Encounter for immunization: Secondary | ICD-10-CM | POA: Diagnosis not present

## 2015-03-03 DIAGNOSIS — Z0000 Encounter for general adult medical examination without abnormal findings: Secondary | ICD-10-CM

## 2015-03-03 DIAGNOSIS — R030 Elevated blood-pressure reading, without diagnosis of hypertension: Secondary | ICD-10-CM | POA: Diagnosis not present

## 2015-03-03 DIAGNOSIS — E559 Vitamin D deficiency, unspecified: Secondary | ICD-10-CM

## 2015-03-03 DIAGNOSIS — R413 Other amnesia: Secondary | ICD-10-CM

## 2015-03-03 DIAGNOSIS — IMO0001 Reserved for inherently not codable concepts without codable children: Secondary | ICD-10-CM

## 2015-03-03 LAB — POCT URINALYSIS DIPSTICK
BILIRUBIN UA: NEGATIVE
GLUCOSE UA: NEGATIVE
KETONES UA: NEGATIVE
Nitrite, UA: NEGATIVE
PH UA: 6
Protein, UA: NEGATIVE
Spec Grav, UA: 1.025
Urobilinogen, UA: NEGATIVE

## 2015-03-03 NOTE — Patient Instructions (Addendum)
HEALTH MAINTENANCE RECOMMENDATIONS:  It is recommended that you get at least 30 minutes of aerobic exercise at least 5 days/week (for weight loss, you may need as much as 60-90 minutes). This can be any activity that gets your heart rate up. This can be divided in 10-15 minute intervals if needed, but try and build up your endurance at least once a week.  Weight bearing exercise is also recommended twice weekly.  Eat a healthy diet with lots of vegetables, fruits and fiber.  "Colorful" foods have a lot of vitamins (ie green vegetables, tomatoes, red peppers, etc).  Limit sweet tea, regular sodas and alcoholic beverages, all of which has a lot of calories and sugar.  Up to 1 alcoholic drink daily may be beneficial for women (unless trying to lose weight, watch sugars).  Drink a lot of water.  Calcium recommendations are 1200-1500 mg daily (1500 mg for postmenopausal women or women without ovaries), and vitamin D 1000 IU daily.  This should be obtained from diet and/or supplements (vitamins), and calcium should not be taken all at once, but in divided doses.  Monthly self breast exams and yearly mammograms for women over the age of 24 is recommended.  Sunscreen of at least SPF 30 should be used on all sun-exposed parts of the skin when outside between the hours of 10 am and 4 pm (not just when at beach or pool, but even with exercise, golf, tennis, and yard work!)  Use a sunscreen that says "broad spectrum" so it covers both UVA and UVB rays, and make sure to reapply every 1-2 hours.  Remember to change the batteries in your smoke detectors when changing your clock times in the spring and fall.  Use your seat belt every time you are in a car, and please drive safely and not be distracted with cell phones and texting while driving.   Your blood pressure was elevated today. Please check it elsewhere.  Ideally we would like it <130/80, but anything <135/85 is fine.  If it remains >140/90, then we need  to work on lowering it. Limiting the sodium/salt in your diet will help. Exercise daily.  Please contact us (MyChart message or phone call) with a list of a few blood pressures sometime in the next month.   Consider taking pepcid, zantac or prilosec PRIOR to a meal that you know will trigger reflux symptoms.   Gastroesophageal Reflux Disease, Adult Normally, food travels down the esophagus and stays in the stomach to be digested. However, when a person has gastroesophageal reflux disease (GERD), food and stomach acid move back up into the esophagus. When this happens, the esophagus becomes sore and inflamed. Over time, GERD can create small holes (ulcers) in the lining of the esophagus.  CAUSES This condition is caused by a problem with the muscle between the esophagus and the stomach (lower esophageal sphincter, or LES). Normally, the LES muscle closes after food passes through the esophagus to the stomach. When the LES is weakened or abnormal, it does not close properly, and that allows food and stomach acid to go back up into the esophagus. The LES can be weakened by certain dietary substances, medicines, and medical conditions, including:  Tobacco use.  Pregnancy.  Having a hiatal hernia.  Heavy alcohol use.  Certain foods and beverages, such as coffee, chocolate, onions, and peppermint. RISK FACTORS This condition is more likely to develop in:  People who have an increased body weight.  People who have connective tissue  disorders.  People who use NSAID medicines. SYMPTOMS Symptoms of this condition include:  Heartburn.  Difficult or painful swallowing.  The feeling of having a lump in the throat.  Abitter taste in the mouth.  Bad breath.  Having a large amount of saliva.  Having an upset or bloated stomach.  Belching.  Chest pain.  Shortness of breath or wheezing.  Ongoing (chronic) cough or a night-time cough.  Wearing away of tooth enamel.  Weight  loss. Different conditions can cause chest pain. Make sure to see your health care provider if you experience chest pain. DIAGNOSIS Your health care provider will take a medical history and perform a physical exam. To determine if you have mild or severe GERD, your health care provider may also monitor how you respond to treatment. You may also have other tests, including:  An endoscopy toexamine your stomach and esophagus with a small camera.  A test thatmeasures the acidity level in your esophagus.  A test thatmeasures how much pressure is on your esophagus.  A barium swallow or modified barium swallow to show the shape, size, and functioning of your esophagus. TREATMENT The goal of treatment is to help relieve your symptoms and to prevent complications. Treatment for this condition may vary depending on how severe your symptoms are. Your health care provider may recommend:  Changes to your diet.  Medicine.  Surgery. HOME CARE INSTRUCTIONS Diet  Follow a diet as recommended by your health care provider. This may involve avoiding foods and drinks such as:  Coffee and tea (with or without caffeine).  Drinks that containalcohol.  Energy drinks and sports drinks.  Carbonated drinks or sodas.  Chocolate and cocoa.  Peppermint and mint flavorings.  Garlic and onions.  Horseradish.  Spicy and acidic foods, including peppers, chili powder, curry powder, vinegar, hot sauces, and barbecue sauce.  Citrus fruit juices and citrus fruits, such as oranges, lemons, and limes.  Tomato-based foods, such as red sauce, chili, salsa, and pizza with red sauce.  Fried and fatty foods, such as donuts, french fries, potato chips, and high-fat dressings.  High-fat meats, such as hot dogs and fatty cuts of red and white meats, such as rib eye steak, sausage, ham, and bacon.  High-fat dairy items, such as whole milk, butter, and cream cheese.  Eat small, frequent meals instead of large  meals.  Avoid drinking large amounts of liquid with your meals.  Avoid eating meals during the 2-3 hours before bedtime.  Avoid lying down right after you eat.  Do not exercise right after you eat. General Instructions  Pay attention to any changes in your symptoms.  Take over-the-counter and prescription medicines only as told by your health care provider. Do not take aspirin, ibuprofen, or other NSAIDs unless your health care provider told you to do so.  Do not use any tobacco products, including cigarettes, chewing tobacco, and e-cigarettes. If you need help quitting, ask your health care provider.  Wear loose-fitting clothing. Do not wear anything tight around your waist that causes pressure on your abdomen.  Raise (elevate) the head of your bed 6 inches (15cm).  Try to reduce your stress, such as with yoga or meditation. If you need help reducing stress, ask your health care provider.  If you are overweight, reduce your weight to an amount that is healthy for you. Ask your health care provider for guidance about a safe weight loss goal.  Keep all follow-up visits as told by your health care provider.  This is important. SEEK MEDICAL CARE IF:  You have new symptoms.  You have unexplained weight loss.  You have difficulty swallowing, or it hurts to swallow.  You have wheezing or a persistent cough.  Your symptoms do not improve with treatment.  You have a hoarse voice. SEEK IMMEDIATE MEDICAL CARE IF:  You have pain in your arms, neck, jaw, teeth, or back.  You feel sweaty, dizzy, or light-headed.  You have chest pain or shortness of breath.  You vomit and your vomit looks like blood or coffee grounds.  You faint.  Your stool is bloody or black.  You cannot swallow, drink, or eat.   This information is not intended to replace advice given to you by your health care provider. Make sure you discuss any questions you have with your health care provider.     Document Released: 09/28/2004 Document Revised: 09/09/2014 Document Reviewed: 04/15/2014 Elsevier Interactive Patient Education [employer].

## 2015-03-03 NOTE — Progress Notes (Signed)
Chief Complaint  Patient presents with  . Annual Exam    fasting annual exam, no pap-sees Dr.Lavoie. UA showed 1+ leuks and 2+ blood, no symptoms. Is having some memory issues. And also increased reflux issues. When she elimates certain foods her symptoms are better.    [name] is a 50 y.o. female who presents for a complete physical.  She has the following concerns:  H/o Vitamin D deficiency:  Level was 15 last year.  She took 12 weeks of prescription, and has been taking 1000 IU daily.  She did notice gradual improvement in her energy. She takes it along with her birth control pill, sometimes forgets during the placebo week.  Reflux--she has had increased problems with reflux over the last year, for longer durations. She cut back on pizza, tomatoes, vinegar, which has helped. Denies dysphagia. Admits she hasn't been eating as well as in the past.  Uses Tums prn symptoms.  Memory concerns:  More difficulty this year--recalling names, takes a lot longer, harder work to remember, but eventually can recall it.  Immunization History  Administered Date(s) Administered  . Influenza Split 09/02/2012  . Influenza,inj,Quad PF,36+ Mos 10/02/2013  . Influenza-Unspecified 11/03/2014   Last TdaP was through Dr. Assunta Curtis office, she believes in 2007 or 2008. She checked in the past, and recalls that it was definitely TdaP, but doesn't recall the date. Last Pap smear: per Dr. Dellis Filbert, UTD (sees her yearly, paps every other year) Last mammogram: 12/2014 Last colonoscopy: never  Last DEXA: never  Dentist: twice yearly  Ophtho: every 2 years, last 09/2014 Exercise: no regular exercise She eats yogurt daily and drinks milk daily. She doesn't eat any nuts (son is allergic), doesn't eat fish. Lipids: Lab Results  Component Value Date   CHOL 194 02/26/2013   HDL 55 02/26/2013   LDLCALC 117* 02/26/2013   TRIG 112 02/26/2013   CHOLHDL 3.5 02/26/2013    Past Medical History  Diagnosis Date   . Allergic rhinitis, cause unspecified     dust, pollen, dogs  . BCC (basal cell carcinoma), face     x2.  Dr. Ubaldo Glassing  . Vitamin D deficiency 03/2010  . Raynaud disease   . Runner's knee   . Tennis elbow     right    Past Surgical History  Procedure Laterality Date  . Bcc- left face      x2 (left face)--Dr. Ubaldo Glassing    Social History   Social History  . Marital Status: Married    Spouse Name: N/A  . Number of Children: 1  . Years of Education: N/A   Occupational History  . homemaker    Social History Main Topics  . Smoking status: Never Smoker   . Smokeless tobacco: Never Used  . Alcohol Use: No  . Drug Use: No  . Sexual Activity:    Partners: Male    Birth Control/ Protection: Pill   Other Topics Concern  . Not on file   Social History Narrative   Married, 1 son.  Homemaker. Previously homeschooled; son is at [employer] at [employer]    Family History  Problem Relation Age of Onset  . Hyperthyroidism Mother   . Glaucoma Mother   . Diabetes Mother   . Basal cell carcinoma Father   . Multiple sclerosis Father   . Irritable bowel syndrome Brother   . Cancer Paternal Grandfather     melanoma  . Melanoma Paternal Grandfather     Outpatient Encounter Prescriptions as  of 03/03/2015  Medication Sig Note  . cholecalciferol (VITAMIN D) 1000 units tablet Take 1,000 Units by mouth daily.   . norethindrone-ethinyl estradiol (JUNEL FE,GILDESS FE,LOESTRIN FE) 1-20 MG-MCG tablet Take 1 tablet by mouth daily.   Marland Kitchen acetaminophen (TYLENOL) 325 MG tablet Take 650 mg by mouth as needed for pain. Reported on 03/03/2015   . fexofenadine (ALLEGRA) 180 MG tablet Take 180 mg by mouth daily. Reported on 03/03/2015   . ibuprofen (ADVIL,MOTRIN) 200 MG tablet Take 200-400 mg by mouth daily. Reported on 03/03/2015   . Ibuprofen (MIDOL PO) Take 2 tablets by mouth as needed. Reported on 03/03/2015   . Pseudoephedrine HCl (SUDAFED PO) Take 1 tablet by mouth as needed. Reported on 03/03/2015   .  [DISCONTINUED] diphenhydrAMINE (BENADRYL) 25 MG tablet Take 25 mg by mouth every 6 (six) hours as needed for itching.   . [DISCONTINUED] fluticasone (FLONASE) 50 MCG/ACT nasal spray Place 2 sprays into both nostrils daily. (Patient not taking: Reported on 02/18/2014)   . [DISCONTINUED] JUNEL 1/20 1-20 MG-MCG tablet  03/03/2015: Received from: External Pharmacy  . [DISCONTINUED] Vitamin D, Ergocalciferol, (DRISDOL) 50000 UNITS CAPS capsule Take 1 capsule (50,000 Units total) by mouth once a week.    No facility-administered encounter medications on file as of 03/03/2015.    No Known Allergies   ROS: The patient denies anorexia, fever, vision changes, decreased hearing, ear pain, sore throat, breast concerns, chest pain, palpitations, dizziness, syncope, dyspnea on exertion, cough, swelling, nausea, vomiting, diarrhea, constipation, abdominal pain, melena, hematochezia, hematuria, incontinence, dysuria, irregular menstrual cycles (takes OCP's continuously), vaginal discharge, odor or itch, genital lesions, numbness, tingling, weakness, tremor, suspicious skin lesions, depression, anxiety, abnormal bleeding/bruising, or enlarged lymph nodes.  R tennis elbow only occasionally flares, better overall. Occasional bilateral knee pain, when walking up steep hills (not so much with stairs), and sitting with legs underneath her for a while. Raynaud's doesn't bother her frequently, just sometimes in cold weather. Not as bad as it used to be. Chronic congestion/allergies. Left sided headaches, related to her cycles (which is why she takes OCP's continuously)--has one x 2 days, thinks her typical hormone-related headache. They are less severe than in the past, dull headache, overall improving. Occasional heartburn, as per HPI.  PHYSICAL EXAM:   BP 140/88 mmHg  Pulse 80  Ht 6' (1.829 m)  Wt 186 lb 6.4 oz (84.55 kg)  BMI 25.27 kg/m2  LMP 02/27/2015 144/90  General Appearance:  Alert, cooperative, no  distress, appears stated age   Head:  Normocephalic, without obvious abnormality, atraumatic   Eyes:  PERRL, conjunctiva/corneas clear, EOM's intact, fundi  benign   Ears:  Normal TM's and external ear canals   Nose:  Nares normal, no drainage or sinus tenderness. Nasal mucosa mildly edematous, pale   Throat:  Lips, mucosa, and tongue normal; teeth and gums normal; cobblestoning posteriorly  Neck:  Supple, no lymphadenopathy; thyroid: no enlargement/tenderness/nodules; no carotid  bruit or JVD   Back:  Spine nontender, no curvature, ROM normal, no CVA tenderness   Lungs:  Clear to auscultation bilaterally without wheezes, rales or ronchi; respirations unlabored   Chest Wall:  No tenderness or deformity   Heart:  Regular rate and rhythm, S1 and S2 normal, no murmur, rub  or gallop   Breast Exam:  Deferred to GYN   Abdomen:  Soft, non-tender, nondistended, normoactive bowel sounds,  no masses, no hepatosplenomegaly   Genitalia:  Deferred to GYN      Extremities:  No clubbing,  cyanosis or edema   Pulses:  2+ and symmetric all extremities   Skin:  Skin color, texture, turgor normal, no rashes or lesions   Lymph nodes:  Cervical, supraclavicular, and axillary nodes normal   Neurologic:  CNII-XII intact, normal strength, sensation and gait; reflexes 2+ and symmetric throughout. Alert and oriented   Psych:    Normal mood, affect, hygiene and grooming             Urine dip--2+ blood, 1+ leuks On menses now  ASSESSMENT/PLAN:  Annual physical exam - Plan: POCT Urinalysis Dipstick, Visual acuity screening, Lipid panel, Glucose, random, VITAMIN D 25 Hydroxy (Vit-D Deficiency, Fractures)  Vitamin D deficiency - Plan: VITAMIN D 25 Hydroxy (Vit-D Deficiency, Fractures)  Immunization due - Plan: Td : Tetanus/diphtheria >7yo Preservative  free  Memory difficulty - mild, related to name  recall only; keep brain active, puzzles/games. f/u if worse  Elevated blood pressure - check BP at home; low sodium diet, regular exercise. follow-up if elevated BP persists.   She wants sugar and TG checked today, reports they have been borderline in the past, and admits her diet hasn't been great  Lipid, glu, vit D  Keep brain active  Discussed monthly self breast exams and yearly mammograms; at least 30 minutes of aerobic activity at least 5 days/week, weight-bearing exercise at least 2x/wk; proper sunscreen use reviewed; healthy diet, including goals of calcium and vitamin D intake and alcohol recommendations (less than or equal to 1 drink/day) reviewed; regular seatbelt use; changing batteries in smoke detectors. Immunization recommendations discussed--Td today. Continue yearly flu shots. Colonoscopy recommendations reviewed--age 82.    Your blood pressure was elevated today. Please check it elsewhere.  Ideally we would like it <130/80, but anything <135/85 is fine.  If it remains >140/90, then we need to work on lowering it. Limiting the sodium/salt in your diet will help. Exercise daily.  Please contact us (MyChart message or phone call) with a list of a few blood pressures sometime in the next month.

## 2015-03-04 LAB — LIPID PANEL
CHOL/HDL RATIO: 3.6 ratio (ref <=5.0)
CHOLESTEROL: 214 mg/dL — AB (ref 125–200)
HDL: 60 mg/dL (ref >=46)
LDL Cholesterol: 130 mg/dL — ABNORMAL HIGH (ref <130)
TRIGLYCERIDES: 120 mg/dL (ref <150)
VLDL: 24 mg/dL (ref <30)

## 2015-03-04 LAB — GLUCOSE, RANDOM: Glucose, Bld: 90 mg/dL (ref 65–99)

## 2015-03-04 LAB — VITAMIN D 25 HYDROXY (VIT D DEFICIENCY, FRACTURES): VIT D 25 HYDROXY: 27 ng/mL — AB (ref 30–100)

## 2015-04-06 ENCOUNTER — Telehealth: Payer: Self-pay | Admitting: Family Medicine

## 2015-04-06 NOTE — Telephone Encounter (Signed)
Pt's bp has continues to be elevated since her last visit. She modified her diet and watched salt intake but that has not seemed to help. Pt will bring list of bp readings to the office for Dr Tomi Bamberger tomorrow. What should she do now?

## 2015-04-06 NOTE — Telephone Encounter (Signed)
If they are consistently >140/90, then schedule OV for tomorrow and bring list.  Otherwise, if only sporadically >140/90, I'll look at her list and decide when to see her back. Also, if scheduling visit, if she has a BP monitor, she should also bring that to her visit, to verify the accuracy (not applicable if she checks it at the pharmacy or elsewhere, just if she has her own monitor that she can bring).

## 2015-04-08 NOTE — Telephone Encounter (Signed)
Patient advised and she will come in for NV next Monday to verify machine and go from there.

## 2015-04-08 NOTE — Telephone Encounter (Signed)
BP's at [facility] range 126-141/88-96. Checked twice at home, and was 120/87, 128/89; stopped checking bc thought not accurate.  Advise pt that BP's remain elevated (diastolic; systolic--top number--is borderline).  I'd like for her to have her home monitor's accuracy checked.  Sometimes it is lower at home because not rushing, shopping, etc and could actually be accurate.  In the long run we wouldn't want these numbers to stay this high, I'd prefer for them to be lower than 130-135/80-85 ideally. It is possible that the birth control could affect blood pressure, and she might want to discuss with her GYN.  She should avoid taking decongestants, limit the salt in her diet, exercise at least 30 minutes daily. She can schedule an OV to discuss further, or a NV, and bring her monitor so that she can use her monitor to check BP, and nurse can check BP, and we can verify if her home monitor is accurate or not

## 2015-04-08 NOTE — Telephone Encounter (Signed)
Pt came in and dropped off her BP readings. I am sending back for review.

## 2015-04-12 ENCOUNTER — Other Ambulatory Visit: Payer: 59

## 2015-06-22 ENCOUNTER — Ambulatory Visit (INDEPENDENT_AMBULATORY_CARE_PROVIDER_SITE_OTHER): Payer: 59 | Admitting: Medical

## 2015-06-22 ENCOUNTER — Encounter: Payer: Self-pay | Admitting: Medical

## 2015-06-22 VITALS — BP 138/98 | HR 77 | Wt 187.0 lb

## 2015-06-22 DIAGNOSIS — L03032 Cellulitis of left toe: Secondary | ICD-10-CM

## 2015-06-22 DIAGNOSIS — L608 Other nail disorders: Secondary | ICD-10-CM

## 2015-06-22 DIAGNOSIS — L609 Nail disorder, unspecified: Secondary | ICD-10-CM

## 2015-06-22 DIAGNOSIS — B351 Tinea unguium: Secondary | ICD-10-CM | POA: Diagnosis not present

## 2015-06-22 NOTE — Progress Notes (Signed)
Subjective: Chief Complaint  Patient presents with  . lt big toe.    has toe nail fungus. thinks it is infected. said it has happened before but normally goes away quickly but is not this time.    Here for possible infection left great toe.  Has had this before a few times.   The toenail has fungus as well.  Didn't seem to be improving as it has in the past.   Has had toenail fungus for a while .  Has used toe nail Laquer in the past for fungus but it didn't work.  Was offered Lamisil oral once but didn't take it.  Has only seen dermatology about this in the past.  No recent fever, but has had redness, drainage from left great toe adjacent to nail, some discomfort, not really painful.  No other aggravating or relieving factors. No other complaint.   Objective: BP 138/98 mmHg  Pulse 77  Wt 187 lb (84.823 kg)  LMP 06/19/2015  Gen: wd, wn, nad Left great toenail thickened with 2 separate horizontal ridges, yellow thickened nail, all c/w toenail fungus.   Medial adjacent soft tissue to toenail with mild erythema, small amount of serosanguinous drainage, no frank pus, nontender.   Right great toenail thickened but has blue toenail polish present Toes and feet neurovascularly intact    Assessment: Encounter Diagnoses  Name Primary?  . Paronychia of great toe, left Yes  . Onychomycosis   . Toenail deformity      Plan: Offered treatment options.   She seems really cautious and wants to go very conservative.   Begin hot soapy water soaks 79min TID, she will c/t neosporin topically TID.  She was advised of symptoms and findings that would prompt recheck or call back.  She declines oral antibiotic which is reasonable at this point as it seems to be healing.   I recommended oral Lamisil for onychomycosis as she has failed other topical therapies in the past.  However, given the recurrent paronychia, advised she treat the toenail fungus.  She declines today and will f/u with her dermatologist  about this.    F/u prn.

## 2015-09-09 ENCOUNTER — Ambulatory Visit (INDEPENDENT_AMBULATORY_CARE_PROVIDER_SITE_OTHER): Payer: 59 | Admitting: Family Medicine

## 2015-09-09 ENCOUNTER — Encounter: Payer: Self-pay | Admitting: Family Medicine

## 2015-09-09 VITALS — BP 124/82 | HR 72 | Ht 72.0 in | Wt 184.8 lb

## 2015-09-09 DIAGNOSIS — F411 Generalized anxiety disorder: Secondary | ICD-10-CM

## 2015-09-09 DIAGNOSIS — R030 Elevated blood-pressure reading, without diagnosis of hypertension: Secondary | ICD-10-CM | POA: Diagnosis not present

## 2015-09-09 DIAGNOSIS — Z23 Encounter for immunization: Secondary | ICD-10-CM

## 2015-09-09 DIAGNOSIS — IMO0001 Reserved for inherently not codable concepts without codable children: Secondary | ICD-10-CM | POA: Insufficient documentation

## 2015-09-09 NOTE — Progress Notes (Signed)
Chief Complaint  Patient presents with  . Hypertension    has been varied readings. Highest has been 141/90.    She brought in her monitor for check in April, felt it was reading high/not accurate.  She took a break from checking BP (was making her anxious).  She cut back on her sodium intake.  Since school started she has been walking 5 days/week for an hour.  BP's at pharmacy are up to 141/89-91 when she first gets there.  She repeats it at least once, and it reduces to 128-134/upper 80's or low 90's. Diastolic remains high, but the systolic comes down when she rests/repeats.   She has headaches intermittently.  She had one last night, relieved by Tylenol.  Sometimes has pain in her left axilla (not currently), usually in the evening, not with walking/exertion.  She has some anxiety, panics, heart races. This happens if it is very sunny in the morning, fear that she overslept. Many things can trigger her to feel anxious, heart race. She has had some palpitations in the past, not often, had normal EKG (at least 5 years ago).  She denies exertional chest pain, only very infrequent palpitations.  No shortness of breath.  She has been cutting back on her sugar, has many questions regarding blood pressure, effect of sugar on her blood pressure, questions about sugar and fruit.  Review of chart shows that her sugars have been normal when screened in the past (no h/o IFG, prediabetes, so not sure where all the questions about sugar stem from).  She also has questions about her cholesterol--last was: Lab Results  Component Value Date   CHOL 214 (H) 03/03/2015   HDL 60 03/03/2015   LDLCALC 130 (H) 03/03/2015   TRIG 120 03/03/2015   CHOLHDL 3.6 03/03/2015   PMH, PSH, SH reviewed  Outpatient Encounter Prescriptions as of 09/09/2015  Medication Sig Note  . cholecalciferol (VITAMIN D) 1000 units tablet Take 1,000 Units by mouth daily.   . norethindrone-ethinyl estradiol (JUNEL FE,GILDESS  FE,LOESTRIN FE) 1-20 MG-MCG tablet Take 1 tablet by mouth daily.   Marland Kitchen acetaminophen (TYLENOL) 325 MG tablet Take 650 mg by mouth as needed for pain. Reported on 06/22/2015   . fexofenadine (ALLEGRA) 180 MG tablet Take 180 mg by mouth daily. Reported on 06/22/2015   . ibuprofen (ADVIL,MOTRIN) 200 MG tablet Take 200-400 mg by mouth daily. Reported on 06/22/2015   . Ibuprofen (MIDOL PO) Take 2 tablets by mouth as needed. Reported on 06/22/2015   . Pseudoephedrine HCl (SUDAFED PO) Take 1 tablet by mouth as needed. Reported on 06/22/2015   . [DISCONTINUED] JUNEL 1/20 1-20 MG-MCG tablet  09/09/2015: Received from: External Pharmacy   No facility-administered encounter medications on file as of 09/09/2015.    No Known Allergies  ROS: no fever, chills, nausea, vomiting, bowel changes. +intermittent headaches. No shortness of breath, URI symptoms. No edema.  No bleeding, bruising, rash. +anxiety, especially related to her son.  PHYSICAL EXAM: BP (!) 138/98 (BP Location: Right Arm, Patient Position: Sitting, Cuff Size: Normal)   Pulse 72   Ht 6' (1.829 m)   Wt 184 lb 12.8 oz (83.8 kg)   BMI 25.06 kg/m   128/86 on repeat by nurse 124/82 on repeat by MD  Well developed, anxious female, talkative with many questions HEENT: PERRL, EOMI, conjunctiva and sclera are clear Neck: no lymphadenopathy, thyromegaly or mass Heart: regular rate and rhythm without murmur; no ectopy Lungs: clear bilaterally Extremities: no edema, normal pulses Psych:  anxious. Full range of affect.  Normal hygiene and grooming Neuro: alert and oriented, cranial nerves intact, normal strength, gait  ASSESSMENT/PLAN:  Elevated blood pressure - normal on repeat; suspect related to anxiety. At first discussed beta blocker (toprol XL 25) but with BP normal on repeat, prefer tx anxiety  Generalized anxiety disorder - encouraged treatment, as this is the cause of her elevated BP's which adds to anxiety; encouraged counseling, consider  medication. f/u to discuss meds if needed  Need for prophylactic vaccination and inoculation against influenza - Plan: Flu Vaccine QUAD 36+ mos PF IM (Fluarix & Fluzone Quad PF)   Many questions answered--including the various causes of elevated blood pressure, risks of HTN, other cardiac risk factors. Reassured re: sugar--she hasn't had any abnormalities to focus on this as much as she has.  Healthy diet recommended.  Continue low sodium diet and daily exercise.  Originally discussed starting metoprolol succinate 25mg  once daily, thinking this might help with her fight/flight/anxiety symptoms, but when repeat BP's were so low, thought it might make her potentially feel worse, and likely should start with treating anxiety.  Given card for [employer]. Encouraged counseling, stress reduction. F/u if needed to discuss preventative vs prn meds.  Well over 35 mins spent with patient today, more than 1/2 spent counseling.

## 2015-09-09 NOTE — Patient Instructions (Addendum)
  Your repeat blood pressure was fine.  I truly believe anxiety is a large component of why your blood pressure is high at times.  I recommend seeing a psychologist to work on treating anxiety, and return here if it is felt that medications are needed to assist in treating the anxiety.  Generalized Anxiety Disorder Generalized anxiety disorder (GAD) is a mental disorder. It interferes with life functions, including relationships, work, and school. GAD is different from normal anxiety, which everyone experiences at some point in their lives in response to specific life events and activities. Normal anxiety actually helps [place] prepare for and get through these life events and activities. Normal anxiety goes away after the event or activity is over.  GAD causes anxiety that is not necessarily related to specific events or activities. It also causes excess anxiety in proportion to specific events or activities. The anxiety associated with GAD is also difficult to control. GAD can vary from mild to severe. People with severe GAD can have intense waves of anxiety with physical symptoms (panic attacks).  SYMPTOMS The anxiety and worry associated with GAD are difficult to control. This anxiety and worry are related to many life events and activities and also occur more days than not for 6 months or longer. People with GAD also have three or more of the following symptoms (one or more in children):  Restlessness.   Fatigue.  Difficulty concentrating.   Irritability.  Muscle tension.  Difficulty sleeping or unsatisfying sleep. DIAGNOSIS GAD is diagnosed through an assessment by your health care provider. Your health care provider will ask you questions aboutyour mood,physical symptoms, and events in your life. Your health care provider may ask you about your medical history and use of alcohol or drugs, including prescription medicines. Your health care provider may also do a physical exam and blood  tests. Certain medical conditions and the use of certain substances can cause symptoms similar to those associated with GAD. Your health care provider may refer you to a mental health specialist for further evaluation. TREATMENT The following therapies are usually used to treat GAD:   Medication. Antidepressant medication usually is prescribed for long-term daily control. Antianxiety medicines may be added in severe cases, especially when panic attacks occur.   Talk therapy (psychotherapy). Certain types of talk therapy can be helpful in treating GAD by providing support, education, and guidance. A form of talk therapy called cognitive behavioral therapy can teach you healthy ways to think about and react to daily life events and activities.  Stress managementtechniques. These include yoga, meditation, and exercise and can be very helpful when they are practiced regularly. A mental health specialist can help determine which treatment is best for you. Some people see improvement with one therapy. However, other people require a combination of therapies.   This information is not intended to replace advice given to you by your health care provider. Make sure you discuss any questions you have with your health care provider.   Document Released: 04/15/2012 Document Revised: 01/09/2014 Document Reviewed: 04/15/2012 Elsevier Interactive Patient Education [employer].

## 2015-09-15 ENCOUNTER — Encounter: Payer: Self-pay | Admitting: Family Medicine

## 2015-10-20 ENCOUNTER — Other Ambulatory Visit: Payer: Self-pay | Admitting: Obstetrics & Gynecology

## 2015-10-20 DIAGNOSIS — Z1231 Encounter for screening mammogram for malignant neoplasm of breast: Secondary | ICD-10-CM

## 2015-12-10 ENCOUNTER — Ambulatory Visit: Payer: 59

## 2016-01-21 ENCOUNTER — Ambulatory Visit: Payer: 59

## 2016-02-14 ENCOUNTER — Ambulatory Visit
Admission: RE | Admit: 2016-02-14 | Discharge: 2016-02-14 | Disposition: A | Discharge status: Home / Self Care | Payer: 59 | Source: Ambulatory Visit | Attending: Obstetrics & Gynecology | Admitting: Obstetrics & Gynecology

## 2016-02-14 DIAGNOSIS — Z1231 Encounter for screening mammogram for malignant neoplasm of breast: Secondary | ICD-10-CM | POA: Diagnosis not present

## 2016-02-21 ENCOUNTER — Other Ambulatory Visit: Payer: Self-pay | Admitting: Obstetrics & Gynecology

## 2016-02-21 DIAGNOSIS — R928 Other abnormal and inconclusive findings on diagnostic imaging of breast: Secondary | ICD-10-CM

## 2016-02-23 ENCOUNTER — Ambulatory Visit
Admission: RE | Admit: 2016-02-23 | Discharge: 2016-02-23 | Disposition: A | Discharge status: Home / Self Care | Payer: 59 | Source: Ambulatory Visit | Attending: Obstetrics & Gynecology | Admitting: Obstetrics & Gynecology

## 2016-02-23 DIAGNOSIS — R922 Inconclusive mammogram: Secondary | ICD-10-CM | POA: Diagnosis not present

## 2016-02-23 DIAGNOSIS — R928 Other abnormal and inconclusive findings on diagnostic imaging of breast: Secondary | ICD-10-CM

## 2016-03-24 DIAGNOSIS — Z85828 Personal history of other malignant neoplasm of skin: Secondary | ICD-10-CM | POA: Diagnosis not present

## 2016-03-24 DIAGNOSIS — D2330 Other benign neoplasm of skin of unspecified part of face: Secondary | ICD-10-CM | POA: Diagnosis not present

## 2016-03-24 DIAGNOSIS — D225 Melanocytic nevi of trunk: Secondary | ICD-10-CM | POA: Diagnosis not present

## 2016-03-24 DIAGNOSIS — D485 Neoplasm of uncertain behavior of skin: Secondary | ICD-10-CM | POA: Diagnosis not present

## 2016-04-06 ENCOUNTER — Encounter: Payer: Self-pay | Admitting: Family Medicine

## 2016-04-19 ENCOUNTER — Encounter: Payer: 59 | Admitting: Family Medicine

## 2016-05-19 DIAGNOSIS — L819 Disorder of pigmentation, unspecified: Secondary | ICD-10-CM | POA: Diagnosis not present

## 2016-05-19 DIAGNOSIS — D225 Melanocytic nevi of trunk: Secondary | ICD-10-CM | POA: Diagnosis not present

## 2016-05-19 DIAGNOSIS — L821 Other seborrheic keratosis: Secondary | ICD-10-CM | POA: Diagnosis not present

## 2016-05-19 DIAGNOSIS — D485 Neoplasm of uncertain behavior of skin: Secondary | ICD-10-CM | POA: Diagnosis not present

## 2016-05-19 DIAGNOSIS — Z85828 Personal history of other malignant neoplasm of skin: Secondary | ICD-10-CM | POA: Diagnosis not present

## 2016-05-26 ENCOUNTER — Encounter: Payer: Self-pay | Admitting: Family Medicine

## 2016-09-20 ENCOUNTER — Telehealth: Payer: Self-pay | Admitting: Family Medicine

## 2016-09-20 DIAGNOSIS — N23 Unspecified renal colic: Secondary | ICD-10-CM | POA: Diagnosis not present

## 2016-09-20 DIAGNOSIS — R319 Hematuria, unspecified: Secondary | ICD-10-CM | POA: Diagnosis not present

## 2016-09-20 NOTE — Telephone Encounter (Signed)
Pt called and spoke to [place] and stated that she was seen at [facility] today and was diagnosed with a Kidney Stone and that the urgent care wanted [place] to set her up for a CT. I called the urgent care to try to get some documentation of her visit today and was informed that she was given other information. Per Urgent Care pt was given a strainer and told if she had continued issues she could go to the ER or follow up with her PCP. Pt was called back and informed. She made a appt for tomorrow morning.

## 2016-09-21 ENCOUNTER — Encounter: Payer: Self-pay | Admitting: Family Medicine

## 2016-09-21 ENCOUNTER — Ambulatory Visit (INDEPENDENT_AMBULATORY_CARE_PROVIDER_SITE_OTHER): Payer: 59 | Admitting: Family Medicine

## 2016-09-21 VITALS — BP 140/80 | HR 68 | Ht 72.0 in | Wt 195.2 lb

## 2016-09-21 DIAGNOSIS — Z23 Encounter for immunization: Secondary | ICD-10-CM

## 2016-09-21 DIAGNOSIS — N200 Calculus of kidney: Secondary | ICD-10-CM | POA: Diagnosis not present

## 2016-09-21 LAB — POCT URINALYSIS DIP (PROADVANTAGE DEVICE)
Bilirubin, UA: NEGATIVE
Glucose, UA: NEGATIVE mg/dL
Ketones, POC UA: NEGATIVE mg/dL
Leukocytes, UA: NEGATIVE
NITRITE UA: NEGATIVE
PH UA: 6 (ref 5.0–8.0)
RBC UA: NEGATIVE
Specific Gravity, Urine: 1.03
UUROB: NEGATIVE

## 2016-09-21 NOTE — Patient Instructions (Signed)
Please stay well hydrated. Avoid taking Tums, or other calcium supplements (continue to try and get calcium through your diet for your bones). You may want to use zantac or pepcid prior to meals (before the evening meal, or twice daily, if needed) to help prevent heartburn, for meals which usually trigger it. Below find some information on heartburn.   Kidney Stones Kidney stones (urolithiasis) are solid, rock-like deposits that form inside of the organs that make urine (kidneys). A kidney stone may form in a kidney and move into the bladder, where it can cause intense pain and block the flow of urine. Kidney stones are created when high levels of certain minerals are found in the urine. They are usually passed through urination, but in some cases, medical treatment may be needed to remove them. What are the causes? Kidney stones may be caused by:  A condition in which certain glands produce too much parathyroid hormone (primary hyperparathyroidism), which causes too much calcium buildup in the blood.  Buildup of uric acid crystals in the bladder (hyperuricosuria). Uric acid is a chemical that the body produces when you eat certain foods. It usually exits the body in the urine.  Narrowing (stricture) of one or both of the tubes that drain urine from the kidneys to the bladder (ureters).  A kidney blockage that is present at birth (congenital obstruction).  Past surgery on the kidney or the ureters, such as gastric bypass surgery.  What increases the risk? The following factors make you more likely to develop kidney stones:  Having had a kidney stone in the past.  Having a family history of kidney stones.  Not drinking enough water.  Eating a diet that is high in protein, salt (sodium), or sugar.  Being overweight or obese.  What are the signs or symptoms? Symptoms of a kidney stone may include:  Nausea.  Vomiting.  Blood in the urine (hematuria).  Pain in the side of the  abdomen, right below the ribs (flank pain). Pain usually spreads (radiates) to the groin.  Needing to urinate frequently or urgently.  How is this diagnosed? This condition may be diagnosed based on:  Your medical history.  A physical exam.  Blood tests.  Urine tests.  CT scan.  Abdominal X-ray.  A procedure to examine the inside of the bladder (cystoscopy).  How is this treated? Treatment for kidney stones depends on the size, location, and makeup of the stones. Treatment may involve:  Analyzing your urine before and after you pass the stone through urination.  Being monitored at the hospital until you pass the stone through urination.  Increasing your fluid intake and decreasing the amount of calcium and protein in your diet.  A procedure to break up kidney stones in the bladder using: ? A focused beam of light (laser therapy). ? Shock waves (extracorporeal shock wave lithotripsy).  Surgery to remove kidney stones. This may be needed if you have severe pain or have stones that block your urinary tract.  Follow these instructions at home: Eating and drinking   Drink enough fluid to keep your urine clear or pale yellow. This will help you to pass the kidney stone.  If directed, change your diet. This may include: ? Limiting how much sodium you eat. ? Eating more fruits and vegetables. ? Limiting how much meat, poultry, fish, and eggs you eat.  Follow instructions from your health care provider about eating or drinking restrictions. General instructions  Collect urine samples as told by  your health care provider. You may need to collect a urine sample: ? 24 hours after you pass the stone. ? 8-12 weeks after passing the kidney stone, and every 6-12 months after that.  Strain your urine every time you urinate, for as long as directed. Use the strainer that your health care provider recommends.  Do not throw out the kidney stone after passing it. Keep the stone so  it can be tested by your health care provider. Testing the makeup of your kidney stone may help prevent you from getting kidney stones in the future.  Take over-the-counter and prescription medicines only as told by your health care provider.  Keep all follow-up visits as told by your health care provider. This is important. You may need follow-up X-rays or ultrasounds to make sure that your stone has passed. How is this prevented? To prevent another kidney stone:  Drink enough fluid to keep your urine clear or pale yellow. This is the best way to prevent kidney stones.  Eat a healthy diet and follow recommendations from your health care provider about foods to avoid. You may be instructed to eat a low-protein diet. Recommendations vary depending on the type of kidney stone that you have.  Maintain a healthy weight.  Contact a health care provider if:  You have pain that gets worse or does not get better with medicine. Get help right away if:  You have a fever or chills.  You develop severe pain.  You develop new abdominal pain.  You faint.  You are unable to urinate. This information is not intended to replace advice given to you by your health care provider. Make sure you discuss any questions you have with your health care provider. Document Released: 12/19/2004 Document Revised: 07/09/2015 Document Reviewed: 06/04/2015 Elsevier Interactive Patient Education  2017 [place] for Gastroesophageal Reflux Disease, Adult When you have gastroesophageal reflux disease (GERD), the foods you eat and your eating habits are very important. Choosing the right foods can help ease the discomfort of GERD. Consider working with a diet and nutrition specialist (dietitian) to help you make healthy food choices. What general guidelines should I follow? Eating plan  Choose healthy foods low in fat, such as fruits, vegetables, whole grains, low-fat dairy products, and lean  meat, fish, and poultry.  Eat frequent, small meals instead of three large meals each day. Eat your meals slowly, in a relaxed setting. Avoid bending over or lying down until 2-3 hours after eating.  Limit high-fat foods such as fatty meats or fried foods.  Limit your intake of oils, butter, and shortening to less than 8 teaspoons each day.  Avoid the following: ? Foods that cause symptoms. These may be different for different people. Keep a food diary to keep track of foods that cause symptoms. ? Alcohol. ? Drinking large amounts of liquid with meals. ? Eating meals during the 2-3 hours before bed.  Cook foods using methods other than frying. This may include baking, grilling, or broiling. Lifestyle   Maintain a healthy weight. Ask your health care provider what weight is healthy for you. If you need to lose weight, work with your health care provider to do so safely.  Exercise for at least 30 minutes on 5 or more days each week, or as told by your health care provider.  Avoid wearing clothes that fit tightly around your waist and chest.  Do not use any products that contain nicotine or tobacco, such  as cigarettes and e-cigarettes. If you need help quitting, ask your health care provider.  Sleep with the head of your bed raised. Use a wedge under the mattress or blocks under the bed frame to raise the head of the bed. What foods are not recommended? The items listed may not be a complete list. Talk with your dietitian about what dietary choices are best for you. Grains Pastries or quick breads with added fat. [place] toast. Vegetables Deep fried vegetables. [place] fries. Any vegetables prepared with added fat. Any vegetables that cause symptoms. For some people this may include tomatoes and tomato products, chili peppers, onions and garlic, and horseradish. Fruits Any fruits prepared with added fat. Any fruits that cause symptoms. For some people this may include citrus fruits, such  as oranges, grapefruit, pineapple, and lemons. Meats and other protein foods High-fat meats, such as fatty beef or pork, hot dogs, ribs, ham, sausage, salami and bacon. Fried meat or protein, including fried fish and fried chicken. Nuts and nut butters. Dairy Whole milk and chocolate milk. Sour cream. Cream. Ice cream. Cream cheese. Milk shakes. Beverages Coffee and tea, with or without caffeine. Carbonated beverages. Sodas. Energy drinks. Fruit juice made with acidic fruits (such as orange or grapefruit). Tomato juice. Alcoholic drinks. Fats and oils Butter. Margarine. Shortening. Ghee. Sweets and desserts Chocolate and cocoa. Donuts. Seasoning and other foods Pepper. Peppermint and spearmint. Any condiments, herbs, or seasonings that cause symptoms. For some people, this may include curry, hot sauce, or vinegar-based salad dressings. Summary  When you have gastroesophageal reflux disease (GERD), food and lifestyle choices are very important to help ease the discomfort of GERD.  Eat frequent, small meals instead of three large meals each day. Eat your meals slowly, in a relaxed setting. Avoid bending over or lying down until 2-3 hours after eating.  Limit high-fat foods such as fatty meat or fried foods. This information is not intended to replace advice given to you by your health care provider. Make sure you discuss any questions you have with your health care provider. Document Released: 12/19/2004 Document Revised: 12/21/2015 Document Reviewed: 12/21/2015 Elsevier Interactive Patient Education  2017 [employer].

## 2016-09-21 NOTE — Progress Notes (Signed)
Chief Complaint  Patient presents with  . Follow-up    from UC visit-passed kidney stone yesterday after UC visit.    9/17 she noticed some urinary urgency, but no dysuria.  She developed left flank pain the following day, and into her left lower abdomen.  Pain resolved after about 60-90 minutes.  Yesterday, mid-day, she had recurrent pain in the left flank and LLQ.  Pain was more severe, husband brought her to UC.  She had associated nausea, worse whenever she got up (better when laying down). They told her she had blood in her urine, diagnosed with suspected kidney stone.  She was given shots of toradol and phenergan. She was a little groggy, pain was improved by the time she left the UC.  She took a pain pill last evening (was only mild).   Feels "back to normal" now.  She has very slight burning with voiding that developed yesterday and persists.  Urgency symptoms resolved.  Flank and abdominal pain resolved.  She has been using strainer, passed stone this morning, and brings it in a ziploc baggie.  She admits that she has been needing to take Tums pretty frequently recently. Denies other calcium supplements.  +FH father and brother with kidney stones  PMH, PSH, SH and FH were reviewed/updated  Outpatient Encounter Prescriptions as of 09/21/2016  Medication Sig  . cholecalciferol (VITAMIN D) 1000 units tablet Take 1,000 Units by mouth daily.  Marland Kitchen HYDROcodone-acetaminophen (NORCO/VICODIN) 5-325 MG tablet Take 1 tablet by mouth every 4 (four) hours as needed.   Marland Kitchen acetaminophen (TYLENOL) 325 MG tablet Take 650 mg by mouth as needed for pain. Reported on 06/22/2015  . fexofenadine (ALLEGRA) 180 MG tablet Take 180 mg by mouth daily. Reported on 06/22/2015  . ibuprofen (ADVIL,MOTRIN) 200 MG tablet Take 200-400 mg by mouth daily. Reported on 06/22/2015  . Ibuprofen (MIDOL PO) Take 2 tablets by mouth as needed. Reported on 06/22/2015  . norethindrone-ethinyl estradiol (JUNEL FE,GILDESS  FE,LOESTRIN FE) 1-20 MG-MCG tablet Take 1 tablet by mouth daily.  . Pseudoephedrine HCl (SUDAFED PO) Take 1 tablet by mouth as needed. Reported on 06/22/2015  . [DISCONTINUED] JUNEL 1/20 1-20 MG-MCG tablet    No facility-administered encounter medications on file as of 09/21/2016.    No Known Allergies  ROS: no fever, chills, URI symptoms headaches, dizziness, nausea, vomiting, diarrhea.  +reflux per HPI.  Urinary complaints per HPI.  No bleeding, bruising, rash.   PHYSICAL EXAM: BP 140/80 (BP Location: Left Arm, Patient Position: Sitting, Cuff Size: Normal)   Pulse 68   Ht 6' (1.829 m)   Wt 195 lb 3.2 oz (88.5 kg)   BMI 26.47 kg/m   Well appearing, pleasant female in no distress HEENT: conjunctiva and sclera are clear. OP clear, moist mucus membranes Back: no spinal or CVA tenderness Heart: regular rate and rhythm Lungs: clear bilaterally Abdomen: soft, nontender, no masses Extremities: no edema Skin: normal turgor, no rashes Psych: normal mood, affect, hygiene and grooming  Urine dip; trace protein. No Leuks or blood  ASSESSMENT/PLAN:  Kidney stone - Plan: Stone analysis, POCT Urinalysis DIP (Proadvantage Device)  Need for influenza vaccination - Plan: Flu Vaccine QUAD 6+ mos PF IM (Fluarix Quad PF)  Encouraged adequate hydration.  Avoid Tums. Discussed reflux precautions and use of H2 blockers prior to meals instead (vs Mylanta/Maalox prn).  Stone sent for analysis. No evidence of infection. Given her course, wouldn't be surprised if there is another small stone passed.  Discussed reasons to seek  additional care (concern for obstructed stone, need for CT)

## 2016-09-25 LAB — STONE ANALYSIS: Stone Weight: 0.005 g

## 2016-11-02 DIAGNOSIS — Z85828 Personal history of other malignant neoplasm of skin: Secondary | ICD-10-CM | POA: Diagnosis not present

## 2016-11-02 DIAGNOSIS — D225 Melanocytic nevi of trunk: Secondary | ICD-10-CM | POA: Diagnosis not present

## 2016-11-02 DIAGNOSIS — L821 Other seborrheic keratosis: Secondary | ICD-10-CM | POA: Diagnosis not present

## 2016-11-02 DIAGNOSIS — L820 Inflamed seborrheic keratosis: Secondary | ICD-10-CM | POA: Diagnosis not present

## 2016-11-05 NOTE — Progress Notes (Signed)
Chief Complaint  Patient presents with  . Annual Exam    fasting (light breakfast @ 6:15am) no pap -sees Dr.LaVoie. Just had recent eye exam. Worried about her blood pressure and lab results. Wants to discuss a diet specific for people with kidney stones.     [name] is a 51 y.o. female who presents for a complete physical.   She had a kidney stone in September.  She is staying well hydrated and hasn't had any further problems. Calcium Oxalate stone. She stopped using Tums for heartburn. Gets calcium in her diet.   She has had h/o elevated blood pressure in the past.  Regular exercise, cutting back on sodium in the diet, had helped keep it controlled in the past.  Previously noted that initial BP at the pharmacy is high, but improved on recheck. Hasn't checked her blood pressure recently. Prior monitor wasn't accurate. She admits that sodium in her diet is high.  She has questions about limiting sodium, as well as carbs in her diet.  H/o Vitamin D deficiency:  Level was 15 in 02/2014.  She took 12 weeks of prescription, and when taking 1000 IU daily, it was 27 in 03/2015. She was advised to increase to 2000 IU daily, and has been doing so. She takes it along with her OCP's, and admits that she still sometimes forgets to take the vitamin when she isn't taking the pills (when stops to allow a cycle).  Reflux-- She cut back on pizza, tomatoes, vinegar, which has helped. Continues to have problems when she eats the wrong foods. She no longer uses Tums (since kidney stone). Denies dysphagia.  Memory concerns were previously noted--mostly recalling names, takes a lot longer to remember--no longer an issue   Immunization History  Administered Date(s) Administered  . Influenza Split 09/02/2012  . Influenza,inj,Quad PF,6+ Mos 10/02/2013, 09/09/2015, 09/21/2016  . Influenza-Unspecified 11/03/2014  . Td 03/03/2015   Last TdaP was through Dr. Assunta Curtis office, she believes in 2007 or 2008. She  checked in the past, and recalls that it was definitely TdaP, but doesn't recall the date. Last Pap smear: per Dr. Dellis Filbert, UTD (sees her yearly in November, paps every other year) Last mammogram: 02/2016 Last colonoscopy: never  Last DEXA: never  Dentist: twice yearly  Ophtho: every 2 years, last 09/2016 Exercise: just recently started walking, and will be joining gym in January (new [employer]). Last week walked 4 days x 60 minutes (prior to that, none). She eats yogurt daily and drinks milk daily. She doesn't eat any nuts (son is allergic), doesn't eat fish. Lipids: Lab Results  Component Value Date   CHOL 214 (H) 03/03/2015   HDL 60 03/03/2015   LDLCALC 130 (H) 03/03/2015   TRIG 120 03/03/2015   CHOLHDL 3.6 03/03/2015   Past Medical History:  Diagnosis Date  . Allergic rhinitis, cause unspecified    dust, pollen, dogs  . BCC (basal cell carcinoma), face    x2.  Dr. Ubaldo Glassing  . Raynaud disease   . Runner's knee   . Tennis elbow    right  . Vitamin D deficiency 03/2010    Past Surgical History:  Procedure Laterality Date  . BCC- Left face     x2 (left face)--Dr. Ubaldo Glassing    Social History   Socioeconomic History  . Marital status: Married    Spouse name: Not on file  . Number of children: 1  . Years of education: Not on file  . Highest education level:  Not on file  Social Needs  . Financial resource strain: Not on file  . Food insecurity - worry: Not on file  . Food insecurity - inability: Not on file  . Transportation needs - medical: Not on file  . Transportation needs - non-medical: Not on file  Occupational History  . Occupation: homemaker  Tobacco Use  . Smoking status: Never Smoker  . Smokeless tobacco: Never Used  Substance and Sexual Activity  . Alcohol use: No  . Drug use: No  . Sexual activity: Yes    Partners: Male    Birth control/protection: Pill  Other Topics Concern  . Not on file  Social History Narrative   Married, 1 son.  Homemaker.  Previously homeschooled; son is at [employer] at [employer]    Family History  Problem Relation Age of Onset  . Hyperthyroidism Mother   . Glaucoma Mother   . Diabetes Mother   . Basal cell carcinoma Father   . Multiple sclerosis Father   . Kidney Stones Father   . Irritable bowel syndrome Brother   . Kidney Stones Brother   . Anxiety disorder Brother   . Cancer Paternal Grandfather        melanoma  . Melanoma Paternal Grandfather   . Skin cancer Paternal Aunt        some aunts with basal cell CA  . Melanoma Paternal Aunt     Outpatient Encounter Medications as of 11/06/2016  Medication Sig Note  . cholecalciferol (VITAMIN D) 1000 units tablet Take 2,000 Units daily by mouth.    . fexofenadine (ALLEGRA) 180 MG tablet Take 180 mg by mouth daily. Reported on 06/22/2015   . norethindrone-ethinyl estradiol (JUNEL FE,GILDESS FE,LOESTRIN FE) 1-20 MG-MCG tablet Take 1 tablet by mouth daily.   Marland Kitchen acetaminophen (TYLENOL) 325 MG tablet Take 650 mg by mouth as needed for pain. Reported on 06/22/2015   . ibuprofen (ADVIL,MOTRIN) 200 MG tablet Take 200-400 mg by mouth daily. Reported on 06/22/2015   . Ibuprofen (MIDOL PO) Take 2 tablets by mouth as needed. Reported on 06/22/2015   . Pseudoephedrine HCl (SUDAFED PO) Take 1 tablet by mouth as needed. Reported on 06/22/2015 11/06/2016: Uses very rarely  . [DISCONTINUED] HYDROcodone-acetaminophen (NORCO/VICODIN) 5-325 MG tablet Take 1 tablet by mouth every 4 (four) hours as needed.     No facility-administered encounter medications on file as of 11/06/2016.     No Known Allergies  ROS: The patient denies anorexia, fever, vision changes, decreased hearing, ear pain, sore throat, breast concerns, chest pain, palpitations, dizziness, syncope, dyspnea on exertion, cough, swelling, nausea, vomiting, diarrhea, constipation, abdominal pain, melena, hematochezia, hematuria, incontinence, dysuria, irregular menstrual cycles (takes OCP's continuously), vaginal  discharge, odor or itch, genital lesions, numbness, tingling, weakness, tremor, suspicious skin lesions, depression, anxiety, abnormal bleeding/bruising, or enlarged lymph nodes.  Occasional bilateral knee pain, when walking up steep hills (not so much with stairs), and sitting with legs underneath her for a while. Raynaud's doesn't bother her frequently, just sometimes in cold weather. Not as bad as it used to be. Chronic congestion/allergies. Left sided headaches, related to her cycles (which is why she takes OCP's continuously)--has one x 2 days, thinks her typical hormone-related headache. They are less severe than in the past, dull headache, overall improving. These remain the same. Occasional heartburn, as per HPI. No further problems with kidney stones. Rare stress incontinence with sneeze. Rare spotting related to her continuous pills. Sees dermatologist regularly, 2x/year    PHYSICAL EXAM:  BP (!) 148/100   Pulse 80   Ht 6' (1.829 m)   Wt 193 lb 6.4 oz (87.7 kg)   LMP 10/18/2016 (Approximate)   BMI 26.23 kg/m   144/96 on repeat by MD  Wt Readings from Last 3 Encounters:  11/06/16 193 lb 6.4 oz (87.7 kg)  09/21/16 195 lb 3.2 oz (88.5 kg)  09/09/15 184 lb 12.8 oz (83.8 kg)    General Appearance:  Alert, cooperative, no distress, appears stated age   Head:  Normocephalic, without obvious abnormality, atraumatic   Eyes:  PERRL, conjunctiva/corneas clear, EOM's intact, fundi benign   Ears:  Normal TM's and external ear canals   Nose:  Nares normal, no drainage or sinus tenderness. Nasal mucosa mild-mod edematous, pale   Throat:  Lips, mucosa, and tongue normal; teeth and gums normal  Neck:  Supple, no lymphadenopathy (very slight, anterior cervical R>L noted just today, per pt, nontender); thyroid: no enlargement/tenderness/nodules; no carotid bruit or JVD   Back:  Spine nontender, no curvature, ROM normal, no CVA tenderness   Lungs:  Clear  to auscultation bilaterally without wheezes, rales or ronchi; respirations unlabored   Chest Wall:  No tenderness or deformity   Heart:  Regular rate and rhythm, S1 and S2 normal, no murmur, rub or gallop   Breast Exam:  Deferred to GYN   Abdomen:  Soft, non-tender, nondistended, normoactive bowel sounds, no masses, no hepatosplenomegaly   Genitalia:  Deferred to GYN      Extremities:  No clubbing, cyanosis or edema   Pulses:  2+ and symmetric all extremities   Skin:  Skin color, texture, turgor normal, no rashes or lesions   Lymph nodes:  Cervical, supraclavicular, and axillary nodes normal   Neurologic:  CNII-XII intact, normal strength, sensation and gait; reflexes 2+ and symmetric throughout. Alert and oriented     Psych:    Normal mood, affect, hygiene and grooming     ASSESSMENT/PLAN:  Annual physical exam - Plan: POCT Urinalysis DIP (Proadvantage Device), TSH, VITAMIN D 25 Hydroxy (Vit-D Deficiency, Fractures), CBC with Differential/Platelet, Comprehensive metabolic panel, Lipid panel  Elevated blood pressure reading - counseled re: low sodium diet, regular exercise. Monitor BP and f/u with list in Jan, sooner if persistently elevated  History of kidney stones - calcium oxalate. Reminded to stay well hydrated, get Ca from her diet, avoid Tums   CBC, c-met, Vit D, TSH, lipids   Discussed monthly self breast exams and yearly mammograms; at least 30 minutes of aerobic activity at least 5 days/week, weight-bearing exercise at least 2x/wk; proper sunscreen use reviewed; healthy diet, including goals of calcium and vitamin D intake and alcohol recommendations (less than or equal to 1 drink/day) reviewed; regular seatbelt use; changing batteries in smoke detectors. Immunization recommendations discussed-- Continue yearly flu shots.Shingrix age 25.  Colonoscopy recommendations reviewed--age 38.  Consider nutrition referral, offered, given  her many questions. We reviewed healthy diet, plate, portion size, cutting back carbs, low sodium diet discussed in detail.  Consider low dose amlodipine for HTN if needed, due to Raynaud's (vs ACE; avoid diuretics)

## 2016-11-06 ENCOUNTER — Encounter: Payer: Self-pay | Admitting: Family Medicine

## 2016-11-06 ENCOUNTER — Ambulatory Visit (INDEPENDENT_AMBULATORY_CARE_PROVIDER_SITE_OTHER): Payer: 59 | Admitting: Family Medicine

## 2016-11-06 VITALS — BP 144/96 | HR 80 | Ht 72.0 in | Wt 193.4 lb

## 2016-11-06 DIAGNOSIS — Z87442 Personal history of urinary calculi: Secondary | ICD-10-CM | POA: Diagnosis not present

## 2016-11-06 DIAGNOSIS — Z0000 Encounter for general adult medical examination without abnormal findings: Secondary | ICD-10-CM | POA: Diagnosis not present

## 2016-11-06 DIAGNOSIS — R030 Elevated blood-pressure reading, without diagnosis of hypertension: Secondary | ICD-10-CM

## 2016-11-06 LAB — POCT URINALYSIS DIP (PROADVANTAGE DEVICE)
BILIRUBIN UA: NEGATIVE
GLUCOSE UA: NEGATIVE mg/dL
Leukocytes, UA: NEGATIVE
Nitrite, UA: NEGATIVE
PH UA: 6 (ref 5.0–8.0)
Protein Ur, POC: NEGATIVE mg/dL
RBC UA: NEGATIVE
SPECIFIC GRAVITY, URINE: 1.025
UUROB: NEGATIVE

## 2016-11-06 NOTE — Patient Instructions (Addendum)
HEALTH MAINTENANCE RECOMMENDATIONS:  It is recommended that you get at least 30 minutes of aerobic exercise at least 5 days/week (for weight loss, you may need as much as 60-90 minutes). This can be any activity that gets your heart rate up. This can be divided in 10-15 minute intervals if needed, but try and build up your endurance at least once a week.  Weight bearing exercise is also recommended twice weekly.  Eat a healthy diet with lots of vegetables, fruits and fiber.  "Colorful" foods have a lot of vitamins (ie green vegetables, tomatoes, red peppers, etc).  Limit sweet tea, regular sodas and alcoholic beverages, all of which has a lot of calories and sugar.  Up to 1 alcoholic drink daily may be beneficial for women (unless trying to lose weight, watch sugars).  Drink a lot of water.  Calcium recommendations are 1200-1500 mg daily (1500 mg for postmenopausal women or women without ovaries), and vitamin D 1000 IU daily.  This should be obtained from diet and/or supplements (vitamins), and calcium should not be taken all at once, but in divided doses.  Monthly self breast exams and yearly mammograms for women over the age of 23 is recommended.  Sunscreen of at least SPF 30 should be used on all sun-exposed parts of the skin when outside between the hours of 10 am and 4 pm (not just when at beach or pool, but even with exercise, golf, tennis, and yard work!)  Use a sunscreen that says "broad spectrum" so it covers both UVA and UVB rays, and make sure to reapply every 1-2 hours.  Remember to change the batteries in your smoke detectors when changing your clock times in the spring and fall.  Use your seat belt every time you are in a car, and please drive safely and not be distracted with cell phones and texting while driving.   Follow low sodium diet.  Check your blood pressure at least once a week, if possible. Record it on the paper given, and bring this sheet to your follow-up visit. Goal  BP is 120/70. If it is consistently 135-140?85-90 or higher, then medication may be needed.  We can give it some time to see if it comes down with exercise and cutting back on the salt.    Low-Sodium Eating Plan Sodium, which is an element that makes up salt, helps you maintain a healthy balance of fluids in your body. Too much sodium can increase your blood pressure and cause fluid and waste to be held in your body. Your health care provider or dietitian may recommend following this plan if you have high blood pressure (hypertension), kidney disease, liver disease, or heart failure. Eating less sodium can help lower your blood pressure, reduce swelling, and protect your heart, liver, and kidneys. What are tips for following this plan? General guidelines  Most people on this plan should limit their sodium intake to 1,500-2,000 mg (milligrams) of sodium each day. Reading food labels  The Nutrition Facts label lists the amount of sodium in one serving of the food. If you eat more than one serving, you must multiply the listed amount of sodium by the number of servings.  Choose foods with less than 140 mg of sodium per serving.  Avoid foods with 300 mg of sodium or more per serving. Shopping  Look for lower-sodium products, often labeled as "low-sodium" or "no salt added."  Always check the sodium content even if foods are labeled as "unsalted" or "no  salt added".  Buy fresh foods. ? Avoid canned foods and premade or frozen meals. ? Avoid canned, cured, or processed meats  Buy breads that have less than 80 mg of sodium per slice. Cooking  Eat more home-cooked food and less restaurant, buffet, and fast food.  Avoid adding salt when cooking. Use salt-free seasonings or herbs instead of table salt or sea salt. Check with your health care provider or pharmacist before using salt substitutes.  Cook with plant-based oils, such as canola, sunflower, or olive oil. Meal planning  When  eating at a restaurant, ask that your food be prepared with less salt or no salt, if possible.  Avoid foods that contain MSG (monosodium glutamate). MSG is sometimes added to [place] food, bouillon, and some canned foods. What foods are recommended? The items listed may not be a complete list. Talk with your dietitian about what dietary choices are best for you. Grains Low-sodium cereals, including oats, puffed wheat and rice, and shredded wheat. Low-sodium crackers. Unsalted rice. Unsalted pasta. Low-sodium bread. Whole-grain breads and whole-grain pasta. Vegetables Fresh or frozen vegetables. "No salt added" canned vegetables. "No salt added" tomato sauce and paste. Low-sodium or reduced-sodium tomato and vegetable juice. Fruits Fresh, frozen, or canned fruit. Fruit juice. Meats and other protein foods Fresh or frozen (no salt added) meat, poultry, seafood, and fish. Low-sodium canned tuna and salmon. Unsalted nuts. Dried peas, beans, and lentils without added salt. Unsalted canned beans. Eggs. Unsalted nut butters. Dairy Milk. Soy milk. Cheese that is naturally low in sodium, such as ricotta cheese, fresh mozzarella, or Swiss cheese Low-sodium or reduced-sodium cheese. Cream cheese. Yogurt. Fats and oils Unsalted butter. Unsalted margarine with no trans fat. Vegetable oils such as canola or olive oils. Seasonings and other foods Fresh and dried herbs and spices. Salt-free seasonings. Low-sodium mustard and ketchup. Sodium-free salad dressing. Sodium-free light mayonnaise. Fresh or refrigerated horseradish. Lemon juice. Vinegar. Homemade, reduced-sodium, or low-sodium soups. Unsalted popcorn and pretzels. Low-salt or salt-free chips. What foods are not recommended? The items listed may not be a complete list. Talk with your dietitian about what dietary choices are best for you. Grains Instant hot cereals. Bread stuffing, pancake, and biscuit mixes. Croutons. Seasoned rice or pasta mixes.  Noodle soup cups. Boxed or frozen macaroni and cheese. Regular salted crackers. Self-rising flour. Vegetables Sauerkraut, pickled vegetables, and relishes. Olives. [place] fries. Onion rings. Regular canned vegetables (not low-sodium or reduced-sodium). Regular canned tomato sauce and paste (not low-sodium or reduced-sodium). Regular tomato and vegetable juice (not low-sodium or reduced-sodium). Frozen vegetables in sauces. Meats and other protein foods Meat or fish that is salted, canned, smoked, spiced, or pickled. Bacon, ham, sausage, hotdogs, corned beef, chipped beef, packaged lunch meats, salt pork, jerky, pickled herring, anchovies, regular canned tuna, sardines, salted nuts. Dairy Processed cheese and cheese spreads. Cheese curds. Blue cheese. Feta cheese. String cheese. Regular cottage cheese. Buttermilk. Canned milk. Fats and oils Salted butter. Regular margarine. Ghee. Bacon fat. Seasonings and other foods Onion salt, garlic salt, seasoned salt, table salt, and sea salt. Canned and packaged gravies. Worcestershire sauce. Tartar sauce. Barbecue sauce. Teriyaki sauce. Soy sauce, including reduced-sodium. Steak sauce. Fish sauce. Oyster sauce. Cocktail sauce. Horseradish that you find on the shelf. Regular ketchup and mustard. Meat flavorings and tenderizers. Bouillon cubes. Hot sauce and Tabasco sauce. Premade or packaged marinades. Premade or packaged taco seasonings. Relishes. Regular salad dressings. Salsa. Potato and tortilla chips. Corn chips and puffs. Salted popcorn and pretzels. Canned or dried soups. Pizza.  Frozen entrees and pot pies. Summary  Eating less sodium can help lower your blood pressure, reduce swelling, and protect your heart, liver, and kidneys.  Most people on this plan should limit their sodium intake to 1,500-2,000 mg (milligrams) of sodium each day.  Canned, boxed, and frozen foods are high in sodium. Restaurant foods, fast foods, and pizza are also very high in  sodium. You also get sodium by adding salt to food.  Try to cook at home, eat more fresh fruits and vegetables, and eat less fast food, canned, processed, or prepared foods. This information is not intended to replace advice given to you by your health care provider. Make sure you discuss any questions you have with your health care provider. Document Released: 06/10/2001 Document Revised: 12/13/2015 Document Reviewed: 12/13/2015 Elsevier Interactive Patient Education  2017 [employer].

## 2016-11-07 LAB — CBC WITH DIFFERENTIAL/PLATELET
BASOS PCT: 0.4 %
Basophils Absolute: 40 cells/uL (ref 0–200)
EOS ABS: 293 {cells}/uL (ref 15–500)
Eosinophils Relative: 2.9 %
HEMATOCRIT: 38.2 % (ref 35.0–45.0)
HEMOGLOBIN: 13.3 g/dL (ref 11.7–15.5)
Lymphs Abs: 2020 cells/uL (ref 850–3900)
MCH: 29.7 pg (ref 27.0–33.0)
MCHC: 34.8 g/dL (ref 32.0–36.0)
MCV: 85.3 fL (ref 80.0–100.0)
MPV: 9.8 fL (ref 7.5–12.5)
Monocytes Relative: 5.6 %
NEUTROS PCT: 71.1 %
Neutro Abs: 7181 cells/uL (ref 1500–7800)
Platelets: 343 10*3/uL (ref 140–400)
RBC: 4.48 10*6/uL (ref 3.80–5.10)
RDW: 11.8 % (ref 11.0–15.0)
Total Lymphocyte: 20 %
WBC: 10.1 10*3/uL (ref 3.8–10.8)
WBCMIX: 566 {cells}/uL (ref 200–950)

## 2016-11-07 LAB — COMPREHENSIVE METABOLIC PANEL
AG Ratio: 1.7 (calc) (ref 1.0–2.5)
ALBUMIN MSPROF: 4.4 g/dL (ref 3.6–5.1)
ALT: 20 U/L (ref 6–29)
AST: 24 U/L (ref 10–35)
Alkaline phosphatase (APISO): 61 U/L (ref 33–115)
BUN: 9 mg/dL (ref 7–25)
CHLORIDE: 103 mmol/L (ref 98–110)
CO2: 27 mmol/L (ref 20–32)
CREATININE: 0.83 mg/dL (ref 0.50–1.10)
Calcium: 9.3 mg/dL (ref 8.6–10.2)
GLOBULIN: 2.6 g/dL (ref 1.9–3.7)
GLUCOSE: 86 mg/dL (ref 65–99)
POTASSIUM: 4.2 mmol/L (ref 3.5–5.3)
SODIUM: 136 mmol/L (ref 135–146)
TOTAL PROTEIN: 7 g/dL (ref 6.1–8.1)
Total Bilirubin: 0.5 mg/dL (ref 0.2–1.2)

## 2016-11-07 LAB — TSH: TSH: 2.07 m[IU]/L

## 2016-11-07 LAB — LIPID PANEL
CHOL/HDL RATIO: 3.7 (calc) (ref <5.0)
Cholesterol: 215 mg/dL — ABNORMAL HIGH (ref <200)
HDL: 58 mg/dL (ref >50)
LDL Cholesterol (Calc): 132 mg/dL (calc) — ABNORMAL HIGH
NON-HDL CHOLESTEROL (CALC): 157 mg/dL — AB (ref <130)
Triglycerides: 130 mg/dL (ref <150)

## 2016-11-07 LAB — VITAMIN D 25 HYDROXY (VIT D DEFICIENCY, FRACTURES): Vit D, 25-Hydroxy: 39 ng/mL (ref 30–100)

## 2016-12-29 ENCOUNTER — Other Ambulatory Visit: Payer: Self-pay | Admitting: Obstetrics & Gynecology

## 2016-12-29 NOTE — Telephone Encounter (Signed)
Paper chart arrived, Rx sent, annual on 02/01/17

## 2017-02-01 ENCOUNTER — Encounter: Payer: Self-pay | Admitting: Obstetrics & Gynecology

## 2017-02-01 ENCOUNTER — Ambulatory Visit (INDEPENDENT_AMBULATORY_CARE_PROVIDER_SITE_OTHER): Payer: 59 | Admitting: Obstetrics & Gynecology

## 2017-02-01 VITALS — BP 144/92 | Ht 71.5 in | Wt 187.0 lb

## 2017-02-01 DIAGNOSIS — I10 Essential (primary) hypertension: Secondary | ICD-10-CM | POA: Diagnosis not present

## 2017-02-01 DIAGNOSIS — Z3041 Encounter for surveillance of contraceptive pills: Secondary | ICD-10-CM | POA: Diagnosis not present

## 2017-02-01 DIAGNOSIS — N943 Premenstrual tension syndrome: Secondary | ICD-10-CM | POA: Diagnosis not present

## 2017-02-01 DIAGNOSIS — Z01419 Encounter for gynecological examination (general) (routine) without abnormal findings: Secondary | ICD-10-CM

## 2017-02-01 MED ORDER — NORETHIN-ETH ESTRAD-FE BIPHAS 1 MG-10 MCG / 10 MCG PO TABS: 1.0000 | ORAL_TABLET | Freq: Every day | ORAL | 5 refills | Status: DC

## 2017-02-01 NOTE — Patient Instructions (Signed)
1. Well female exam with routine gynecological exam Normal gynecologic exam.  Last Pap test November 2017 was negative with negative high risk HPV.  Will repeat Pap test next year.  Breast exam normal.  Will schedule screening mammogram February 2019.  Health labs with family physician.  Management of hypertension with family physician.  2. Encounter for surveillance of contraceptive pills Because of her hypertension, decision to change birth control pill to a lower estrogen pill.  Lo Loestrin FE discussed.  Usage, risks and benefits reviewed.  Prescription sent to pharmacy.    3. PMS (premenstrual syndrome) Will see if symptoms are improved on the new birth control pill.  4. Essential hypertension Management with family physician.  Decision to change birth control pill to the lowest estrogen possible, Lo Loestrin Fe prescribed.  Other orders - Norethindrone-Ethinyl Estradiol-Fe Biphas (LO LOESTRIN FE) 1 MG-10 MCG / 10 MCG tablet; Take 1 tablet by mouth daily. Continuous use  [name], it was a pleasure seeing you today!    Health Maintenance, Female Adopting a healthy lifestyle and getting preventive care can go a long way to promote health and wellness. Talk with your health care provider about what schedule of regular examinations is right for you. This is a good chance for you to check in with your provider about disease prevention and staying healthy. In between checkups, there are plenty of things you can do on your own. Experts have done a lot of research about which lifestyle changes and preventive measures are most likely to keep you healthy. Ask your health care provider for more information. Weight and diet Eat a healthy diet  Be sure to include plenty of vegetables, fruits, low-fat dairy products, and lean protein.  Do not eat a lot of foods high in solid fats, added sugars, or salt.  Get regular exercise. This is one of the most important things you can do for your  health. ? Most adults should exercise for at least 150 minutes each week. The exercise should increase your heart rate and make you sweat (moderate-intensity exercise). ? Most adults should also do strengthening exercises at least twice a week. This is in addition to the moderate-intensity exercise.  Maintain a healthy weight  Body mass index (BMI) is a measurement that can be used to identify possible weight problems. It estimates body fat based on height and weight. Your health care provider can help determine your BMI and help you achieve or maintain a healthy weight.  For females 68 years of age and older: ? A BMI below 18.5 is considered underweight. ? A BMI of 18.5 to 24.9 is normal. ? A BMI of 25 to 29.9 is considered overweight. ? A BMI of 30 and above is considered obese.  Watch levels of cholesterol and blood lipids  You should start having your blood tested for lipids and cholesterol at 50 years of age, then have this test every 5 years.  You may need to have your cholesterol levels checked more often if: ? Your lipid or cholesterol levels are high. ? You are older than 50 years of age. ? You are at high risk for heart disease.  Cancer screening Lung Cancer  Lung cancer screening is recommended for adults 64-22 years old who are at high risk for lung cancer because of a history of smoking.  A yearly low-dose CT scan of the lungs is recommended for people who: ? Currently smoke. ? Have quit within the past 15 years. ? Have at  least a 30-pack-year history of smoking. A pack year is smoking an average of one pack of cigarettes a day for 1 year.  Yearly screening should continue until it has been 15 years since you quit.  Yearly screening should stop if you develop a health problem that would prevent you from having lung cancer treatment.  Breast Cancer  Practice breast self-awareness. This means understanding how your breasts normally appear and feel.  It also means  doing regular breast self-exams. Let your health care provider know about any changes, no matter how small.  If you are in your 20s or 30s, you should have a clinical breast exam (CBE) by a health care provider every 1-3 years as part of a regular health exam.  If you are 57 or older, have a CBE every year. Also consider having a breast X-ray (mammogram) every year.  If you have a family history of breast cancer, talk to your health care provider about genetic screening.  If you are at high risk for breast cancer, talk to your health care provider about having an MRI and a mammogram every year.  Breast cancer gene (BRCA) assessment is recommended for women who have family members with BRCA-related cancers. BRCA-related cancers include: ? Breast. ? Ovarian. ? Tubal. ? Peritoneal cancers.  Results of the assessment will determine the need for genetic counseling and BRCA1 and BRCA2 testing.  Cervical Cancer Your health care provider may recommend that you be screened regularly for cancer of the pelvic organs (ovaries, uterus, and vagina). This screening involves a pelvic examination, including checking for microscopic changes to the surface of your cervix (Pap test). You may be encouraged to have this screening done every 3 years, beginning at age 75.  For women ages 58-65, health care providers may recommend pelvic exams and Pap testing every 3 years, or they may recommend the Pap and pelvic exam, combined with testing for human papilloma virus (HPV), every 5 years. Some types of HPV increase your risk of cervical cancer. Testing for HPV may also be done on women of any age with unclear Pap test results.  Other health care providers may not recommend any screening for nonpregnant women who are considered low risk for pelvic cancer and who do not have symptoms. Ask your health care provider if a screening pelvic exam is right for you.  If you have had past treatment for cervical cancer or a  condition that could lead to cancer, you need Pap tests and screening for cancer for at least 20 years after your treatment. If Pap tests have been discontinued, your risk factors (such as having a new sexual partner) need to be reassessed to determine if screening should resume. Some women have medical problems that increase the chance of getting cervical cancer. In these cases, your health care provider may recommend more frequent screening and Pap tests.  Colorectal Cancer  This type of cancer can be detected and often prevented.  Routine colorectal cancer screening usually begins at 50 years of age and continues through 50 years of age.  Your health care provider may recommend screening at an earlier age if you have risk factors for colon cancer.  Your health care provider may also recommend using home test kits to check for hidden blood in the stool.  A small camera at the end of a tube can be used to examine your colon directly (sigmoidoscopy or colonoscopy). This is done to check for the earliest forms of colorectal cancer.  Routine screening usually begins at age 80.  Direct examination of the colon should be repeated every 5-10 years through 50 years of age. However, you may need to be screened more often if early forms of precancerous polyps or small growths are found.  Skin Cancer  Check your skin from head to toe regularly.  Tell your health care provider about any new moles or changes in moles, especially if there is a change in a mole's shape or color.  Also tell your health care provider if you have a mole that is larger than the size of a pencil eraser.  Always use sunscreen. Apply sunscreen liberally and repeatedly throughout the day.  Protect yourself by wearing long sleeves, pants, a wide-brimmed hat, and sunglasses whenever you are outside.  Heart disease, diabetes, and high blood pressure  High blood pressure causes heart disease and increases the risk of stroke.  High blood pressure is more likely to develop in: ? People who have blood pressure in the high end of the normal range (130-139/85-89 mm Hg). ? People who are overweight or obese. ? People who are African American.  If you are 54-23 years of age, have your blood pressure checked every 3-5 years. If you are 13 years of age or older, have your blood pressure checked every year. You should have your blood pressure measured twice-once when you are at a hospital or clinic, and once when you are not at a hospital or clinic. Record the average of the two measurements. To check your blood pressure when you are not at a hospital or clinic, you can use: ? An automated blood pressure machine at a pharmacy. ? A home blood pressure monitor.  If you are between 52 years and 56 years old, ask your health care provider if you should take aspirin to prevent strokes.  Have regular diabetes screenings. This involves taking a blood sample to check your fasting blood sugar level. ? If you are at a normal weight and have a low risk for diabetes, have this test once every three years after 50 years of age. ? If you are overweight and have a high risk for diabetes, consider being tested at a younger age or more often. Preventing infection Hepatitis B  If you have a higher risk for hepatitis B, you should be screened for this virus. You are considered at high risk for hepatitis B if: ? You were born in a country where hepatitis B is common. Ask your health care provider which countries are considered high risk. ? Your parents were born in a high-risk country, and you have not been immunized against hepatitis B (hepatitis B vaccine). ? You have HIV or AIDS. ? You use needles to inject street drugs. ? You live with someone who has hepatitis B. ? You have had sex with someone who has hepatitis B. ? You get hemodialysis treatment. ? You take certain medicines for conditions, including cancer, organ transplantation, and  autoimmune conditions.  Hepatitis C  Blood testing is recommended for: ? Everyone born from [birth year] through 1963. ? Anyone with known risk factors for hepatitis C.  Sexually transmitted infections (STIs)  You should be screened for sexually transmitted infections (STIs) including gonorrhea and chlamydia if: ? You are sexually active and are younger than 50 years of age. ? You are older than 50 years of age and your health care provider tells you that you are at risk for this type of infection. ? Your sexual activity  has changed since you were last screened and you are at an increased risk for chlamydia or gonorrhea. Ask your health care provider if you are at risk.  If you do not have HIV, but are at risk, it may be recommended that you take a prescription medicine daily to prevent HIV infection. This is called pre-exposure prophylaxis (PrEP). You are considered at risk if: ? You are sexually active and do not regularly use condoms or know the HIV status of your partner(s). ? You take drugs by injection. ? You are sexually active with a partner who has HIV.  Talk with your health care provider about whether you are at high risk of being infected with HIV. If you choose to begin PrEP, you should first be tested for HIV. You should then be tested every 3 months for as long as you are taking PrEP. Pregnancy  If you are premenopausal and you may become pregnant, ask your health care provider about preconception counseling.  If you may become pregnant, take 400 to 800 micrograms (mcg) of folic acid every day.  If you want to prevent pregnancy, talk to your health care provider about birth control (contraception). Osteoporosis and menopause  Osteoporosis is a disease in which the bones lose minerals and strength with aging. This can result in serious bone fractures. Your risk for osteoporosis can be identified using a bone density scan.  If you are 6 years of age or older, or if you are at  risk for osteoporosis and fractures, ask your health care provider if you should be screened.  Ask your health care provider whether you should take a calcium or vitamin D supplement to lower your risk for osteoporosis.  Menopause may have certain physical symptoms and risks.  Hormone replacement therapy may reduce some of these symptoms and risks. Talk to your health care provider about whether hormone replacement therapy is right for you. Follow these instructions at home:  Schedule regular health, dental, and eye exams.  Stay current with your immunizations.  Do not use any tobacco products including cigarettes, chewing tobacco, or electronic cigarettes.  If you are pregnant, do not drink alcohol.  If you are breastfeeding, limit how much and how often you drink alcohol.  Limit alcohol intake to no more than 1 drink per day for nonpregnant women. One drink equals 12 ounces of beer, 5 ounces of wine, or 1 ounces of hard liquor.  Do not use street drugs.  Do not share needles.  Ask your health care provider for help if you need support or information about quitting drugs.  Tell your health care provider if you often feel depressed.  Tell your health care provider if you have ever been abused or do not feel safe at home. This information is not intended to replace advice given to you by your health care provider. Make sure you discuss any questions you have with your health care provider. Document Released: 07/04/2010 Document Revised: 05/27/2015 Document Reviewed: 09/22/2014 Elsevier Interactive Patient Education  [employer].

## 2017-02-01 NOTE — Progress Notes (Signed)
[name] 02-27-1965 [id number]   History:    52 y.o. Married.  G10P1L1 Son is 8 yo, Paramedic at [employer] at [employer].  RP:  Established patient presenting for annual gyn exam   HPI: On Junel 1/20 trying to use continuously, but frequent breakthrough bleeding, so ends up letting a menstrual period come almost every months now.  No pelvic pain.  New diagnosis of hypertension, managing with her family physician, at this time she is on a low-salt diet and trying to improve her fitness, no antihypertensive medications started.  No pain with intercourse.  Normal secretions.  Breasts normal.  Urine and bowel movements normal.  Body mass index 25.72.  Past medical history,surgical history, family history and social history were all reviewed and documented in the EPIC chart.  Gynecologic History Patient's last menstrual period was 01/18/2017. Contraception: OCP (estrogen/progesterone) Last Pap: 11/2015. Results were: Negative/HPV HR negative Last mammogram: 02/2016. Results were: Negative Bone Density: Never Colonoscopy: Never  Obstetric History OB History  Gravida Para Term Preterm AB Living  1 1       1   SAB TAB Ectopic Multiple Live Births               # Outcome Date GA Lbr Len/2nd Weight Sex Delivery Anes PTL Lv  1 Para                ROS: A ROS was performed and pertinent positives and negatives are included in the history.  GENERAL: No fevers or chills. HEENT: No change in vision, no earache, sore throat or sinus congestion. NECK: No pain or stiffness. CARDIOVASCULAR: No chest pain or pressure. No palpitations. PULMONARY: No shortness of breath, cough or wheeze. GASTROINTESTINAL: No abdominal pain, nausea, vomiting or diarrhea, melena or bright red blood per rectum. GENITOURINARY: No urinary frequency, urgency, hesitancy or dysuria. MUSCULOSKELETAL: No joint or muscle pain, no back pain, no recent trauma. DERMATOLOGIC: No rash, no itching, no lesions. ENDOCRINE: No  polyuria, polydipsia, no heat or cold intolerance. No recent change in weight. HEMATOLOGICAL: No anemia or easy bruising or bleeding. NEUROLOGIC: No headache, seizures, numbness, tingling or weakness. PSYCHIATRIC: No depression, no loss of interest in normal activity or change in sleep pattern.     Exam:   BP (!) 144/92   Ht 5' 11.5" (1.816 m)   Wt 187 lb (84.8 kg)   LMP 01/18/2017 Comment: pill  BMI 25.72 kg/m   Body mass index is 25.72 kg/m.  General appearance : Well developed well nourished female. No acute distress HEENT: Eyes: no retinal hemorrhage or exudates,  Neck supple, trachea midline, no carotid bruits, no thyroidmegaly Lungs: Clear to auscultation, no rhonchi or wheezes, or rib retractions  Heart: Regular rate and rhythm, no murmurs or gallops Breast:Examined in sitting and supine position were symmetrical in appearance, no palpable masses or tenderness,  no skin retraction, no nipple inversion, no nipple discharge, no skin discoloration, no axillary or supraclavicular lymphadenopathy Abdomen: no palpable masses or tenderness, no rebound or guarding Extremities: no edema or skin discoloration or tenderness  Pelvic: Vulva: Normal             Vagina: No gross lesions or discharge  Cervix: No gross lesions or discharge  Uterus  AV, normal size, shape and consistency, non-tender and mobile  Adnexa  Without masses or tenderness  Anus: Normal   Assessment/Plan:  52 y.o. female for annual exam   1. Well female exam with routine gynecological exam Normal gynecologic  exam.  Last Pap test November 2017 was negative with negative high risk HPV.  Will repeat Pap test next year.  Breast exam normal.  Will schedule screening mammogram February 2019.  Health labs with family physician.  Management of hypertension with family physician.  2. Encounter for surveillance of contraceptive pills Because of her hypertension, decision to change birth control pill to a lower estrogen pill.   Lo Loestrin FE discussed.  Usage, risks and benefits reviewed.  Prescription sent to pharmacy.    3. PMS (premenstrual syndrome) Will see if symptoms are improved on the new birth control pill.  4. Essential hypertension Management with family physician.  Decision to change birth control pill to the lowest estrogen possible, Lo Loestrin Fe prescribed.  Other orders - Norethindrone-Ethinyl Estradiol-Fe Biphas (LO LOESTRIN FE) 1 MG-10 MCG / 10 MCG tablet; Take 1 tablet by mouth daily. Continuous use  [name] MD, 11:40 AM 02/01/2017

## 2017-02-07 ENCOUNTER — Other Ambulatory Visit: Payer: Self-pay | Admitting: Obstetrics & Gynecology

## 2017-02-07 ENCOUNTER — Telehealth: Payer: Self-pay

## 2017-02-07 ENCOUNTER — Encounter: Payer: Self-pay | Admitting: Obstetrics & Gynecology

## 2017-02-07 DIAGNOSIS — Z1231 Encounter for screening mammogram for malignant neoplasm of breast: Secondary | ICD-10-CM

## 2017-02-07 NOTE — Telephone Encounter (Signed)
Patient called stating the bcp's that Dr. Marguerita Merles prescribed for her are not covered under her zero copymt on her plan.  She asked if Dr. Marguerita Merles could call something in that was zero copymt with her insurance. Asked best way to go about this.  I called her back and per DPR access note on file I left a message in her voice mail and explained no way we can know what all insurance plans cover and how. I suggested she might want to check with her ins co and get list of OC's that meet the criteria she wants and then get that information to [place] and we will let Dr. Marguerita Merles know.

## 2017-02-08 ENCOUNTER — Other Ambulatory Visit: Payer: Self-pay | Admitting: Obstetrics & Gynecology

## 2017-02-08 ENCOUNTER — Other Ambulatory Visit: Payer: Self-pay | Admitting: Gynecology

## 2017-02-08 MED ORDER — NORETHIN ACE-ETH ESTRAD-FE 1-20 MG-MCG PO TABS: 1.0000 | ORAL_TABLET | Freq: Every day | ORAL | 4 refills | Status: DC

## 2017-02-23 ENCOUNTER — Encounter: Payer: Self-pay | Admitting: Obstetrics & Gynecology

## 2017-03-02 ENCOUNTER — Ambulatory Visit
Admission: RE | Admit: 2017-03-02 | Discharge: 2017-03-02 | Disposition: A | Discharge status: Home / Self Care | Payer: 59 | Source: Ambulatory Visit | Attending: Obstetrics & Gynecology | Admitting: Obstetrics & Gynecology

## 2017-03-02 DIAGNOSIS — Z1231 Encounter for screening mammogram for malignant neoplasm of breast: Secondary | ICD-10-CM

## 2017-03-05 ENCOUNTER — Ambulatory Visit: Payer: Self-pay | Admitting: Family Medicine

## 2017-03-18 NOTE — Progress Notes (Signed)
Chief Complaint  Patient presents with  . Hypertension    folow up on blood pressure and did bring list today.     Patient presents for f/u on hypertension (she was supposed to return in January with her list of BP's). At her CPE in November we discussed that she has h/o elevated blood pressure in the past.  Regular exercise, cutting back on sodium in the diet, had helped keep it controlled in the past. She hadn't been checking BP's prior to that visit, and the monitor she used to have wasn't accurate. At that visit, she reported that sodium in her diet was high.  Since November she has cut back on sodium, exercising more.  BP's have been ranging from 116-153/73-95. Many 118-130/80-90 when good with diet and exercise. Blood pressures are lower when going to the gym and watching her salt. She can't always be good, slips up when busy, and then sees higher BP's--for example, this past weekend her son was in robotics competition, she had to eat what they did (fast food), no exercise, and BP's up to 150.  Snores some, some waking up snorting. She has been taking Allegra daily, and flonase x 3 weeks, though she had to stop nasal steroid x 3d, due to some nose bleeds.  Has only been using 1 spray into each nostril. She sometimes wakes up frequently to void.  Stopped drinking water at night, and usually only getting up once for bathroom now, but still some nights she wakes up a lot (insomnia, not to void). Doesn't feel refreshed when she wakes up frequently.  PMH, PSH, SH reviewed  Current Outpatient Medications on File Prior to Visit  Medication Sig Dispense Refill  . cholecalciferol (VITAMIN D) 1000 units tablet Take 2,000 Units daily by mouth.     . famotidine (PEPCID) 20 MG tablet Take 20 mg by mouth as needed for heartburn or indigestion.    . fexofenadine (ALLEGRA) 180 MG tablet Take 180 mg by mouth daily. Reported on 06/22/2015    . fluticasone (FLONASE) 50 MCG/ACT nasal spray Place 2 sprays  into both nostrils daily.    . norethindrone-ethinyl estradiol (JUNEL FE,GILDESS FE,LOESTRIN FE) 1-20 MG-MCG tablet Take 1 tablet by mouth daily. 3 Package 4  . acetaminophen (TYLENOL) 325 MG tablet Take 650 mg by mouth as needed for pain. Reported on 06/22/2015    . ibuprofen (ADVIL,MOTRIN) 200 MG tablet Take 200-400 mg by mouth daily. Reported on 06/22/2015     No current facility-administered medications on file prior to visit.    No Known Allergies  ROS: no fever, chills, URI symptoms, headaches, dizziness, chest pain, shortness of breath, edema, bleeding (just recent nasal bleeding), bruising, rash. No depression/anxiety.  +allergies/congestion, snoring, insomnia, and intermittent unrefreshed sleep as per HPI.   PHYSICAL EXAM:  BP (!) 152/92   Pulse 80   Ht 5' 11.5" (1.816 m)   Wt 187 lb (84.8 kg)   LMP 03/18/2017 (Exact Date)   BMI 25.72 kg/m   Wt Readings from Last 3 Encounters:  03/19/17 187 lb (84.8 kg)  02/01/17 187 lb (84.8 kg)  11/06/16 193 lb 6.4 oz (87.7 kg)   Well appearing, pleasant female in no distress HEENT: PERLR, EOMI, conjunctiva and sclera are clear. Nasal mucosa with mod edema with clear mucus present. Sinuses are nontender. TM's and EACs normal. OP is clear Neck: no lymphadenopathy, thyromegaly or mass, no bruit Heart: regular rate and rhythm, no murmur Lungs: clear bilaterally Extremities: no edema, normal pulses  Psych: normal mood, affect, hygiene and grooming Neuro: alert and oriented, cranial nerves intact, normal gait.  EKG, NSR--no abnl, no LVH   ASSESSMENT/PLAN:  Essential hypertension - lower w/proper diet, exercise, but inconsistent, and frequently high. Start low dose amlodipine (h/o Raynaud's). Start 2.5mg , increase to 5mg  if BP's still high - Plan: Home sleep test, amLODipine (NORVASC) 5 MG tablet, EKG 12-Lead  Snoring - discussed diff between snoring and OSA, sounds like has both - Plan: Home sleep test  Unrefreshed by sleep - Plan:  Home sleep test  Allergic rhinitis, unspecified seasonality, unspecified trigger - inadequate response to current therapy--increase Flonase to 2 sprays/nostril, proper technique reviewed. continue allegra  F/u 4-5 weeks with list of BP's Continue low Na diet, regular exercise   Start amlodipine 1/2 tablet of the 5mg  pill.  After a week, if your blood pressure is still running over 135-140, then increase to the full pill.  Don't increase if you are having some blood pressures <105.  Expect a range of about 30 points.  The high of the range will be improved if continuing to get regular exercise and limit your sodium. Continue to keep your list, and bring to your follow-up.  We are going to refer for a home sleep study (if covered), to help answer any questions as to whether sleep apnea is contributing to your poor energy and higher blood pressures.  Increase your flonase to 2 sprays each nostril daily. Be sure to point this away from the nasal septum to prevent bleeding/irritation. Use saline spray frequently.

## 2017-03-19 ENCOUNTER — Ambulatory Visit (INDEPENDENT_AMBULATORY_CARE_PROVIDER_SITE_OTHER): Payer: 59 | Admitting: Family Medicine

## 2017-03-19 ENCOUNTER — Encounter: Payer: Self-pay | Admitting: Family Medicine

## 2017-03-19 VITALS — BP 152/92 | HR 80 | Ht 71.5 in | Wt 187.0 lb

## 2017-03-19 DIAGNOSIS — R0683 Snoring: Secondary | ICD-10-CM

## 2017-03-19 DIAGNOSIS — J309 Allergic rhinitis, unspecified: Secondary | ICD-10-CM | POA: Diagnosis not present

## 2017-03-19 DIAGNOSIS — I10 Essential (primary) hypertension: Secondary | ICD-10-CM

## 2017-03-19 DIAGNOSIS — G478 Other sleep disorders: Secondary | ICD-10-CM

## 2017-03-19 MED ORDER — AMLODIPINE BESYLATE 5 MG PO TABS: ORAL_TABLET | ORAL | 1 refills | Status: DC

## 2017-03-19 NOTE — Patient Instructions (Signed)
  Start amlodipine 1/2 tablet of the 5mg  pill.  After a week, if your blood pressure is still running over 135-140, then increase to the full pill.  Don't increase if you are having some blood pressures <105.  Expect a range of about 30 points.  The high of the range will be improved if continuing to get regular exercise and limit your sodium. Continue to keep your list, and bring to your follow-up.  We are going to refer for a home sleep study (if covered), to help answer any questions as to whether sleep apnea is contributing to your poor energy and higher blood pressures.  Increase your flonase to 2 sprays each nostril daily. Be sure to point this away from the nasal septum to prevent bleeding/irritation. Use saline spray frequently.

## 2017-04-12 ENCOUNTER — Ambulatory Visit (HOSPITAL_BASED_OUTPATIENT_CLINIC_OR_DEPARTMENT_OTHER): Payer: 59 | Attending: Family Medicine | Admitting: Internal Medicine

## 2017-04-12 DIAGNOSIS — R0683 Snoring: Secondary | ICD-10-CM | POA: Diagnosis not present

## 2017-04-12 DIAGNOSIS — G478 Other sleep disorders: Secondary | ICD-10-CM

## 2017-04-12 DIAGNOSIS — G4733 Obstructive sleep apnea (adult) (pediatric): Secondary | ICD-10-CM | POA: Diagnosis not present

## 2017-04-12 DIAGNOSIS — I10 Essential (primary) hypertension: Secondary | ICD-10-CM | POA: Insufficient documentation

## 2017-04-18 DIAGNOSIS — R0683 Snoring: Secondary | ICD-10-CM | POA: Diagnosis not present

## 2017-04-18 DIAGNOSIS — I10 Essential (primary) hypertension: Secondary | ICD-10-CM

## 2017-04-18 DIAGNOSIS — G478 Other sleep disorders: Secondary | ICD-10-CM | POA: Diagnosis not present

## 2017-04-18 NOTE — Procedures (Signed)
    Patient Name: [name], [name] Date: 04/12/2017 Gender: Female D.O.B: 01-12-1965 Age (years): 51 Referring Provider: Rita Ohara Height (inches): 72 Interpreting Physician: Baird Lyons MD, ABSM Weight (lbs): 185 RPSGT: Gerhard Perches BMI: 25 MRN: [id number] Neck Size: 15.00 <br> <br> CLINICAL INFORMATION Sleep Study Type: HST Indication for sleep study: Hypertension, Snoring Epworth Sleepiness Score: 12  SLEEP STUDY TECHNIQUE A multi-channel overnight portable sleep study was performed. The channels recorded were: nasal airflow, thoracic respiratory movement, and oxygen saturation with a pulse oximetry. Snoring was also monitored.  MEDICATIONS Patient self administered medications include: none reported.  SLEEP ARCHITECTURE Patient was studied for 426.4 minutes. The sleep efficiency was 100.0 % and the patient was supine for 90.9%. The arousal index was 0.0 per hour.  RESPIRATORY PARAMETERS The overall AHI was 7.9 per hour, with a central apnea index of 0.0 per hour. The oxygen nadir was 83% during sleep.  CARDIAC DATA Mean heart rate during sleep was 74.2 bpm.  IMPRESSIONS - Mild obstructive sleep apnea occurred during this study (AHI = 7.9/h). - No significant central sleep apnea occurred during this study (CAI = 0.0/h). - Moderate oxygen desaturation was noted during this study (Min O2 = 83%). Mean 95%. - Patient snored.  DIAGNOSIS - Obstructive Sleep Apnea (327.23 [G47.33 ICD-10])  RECOMMENDATIONS - Treatment for mild OSA is directed at symptoms. If conservative measures are insufficient, then consider CPAP titration study, DME/ autopap, or a fitted oral appliance. Other options would be based on clinical judgment. - Avoid alcohol, sedatives and other CNS depressants that may worsen sleep apnea and disrupt normal sleep architecture. - Sleep hygiene should be reviewed to assess factors that may improve sleep quality. - Weight management and regular  exercise should be initiated or continued.  [Electronically signed] 04/18/2017 01:57 PM  Baird Lyons MD, [place], American Board of Sleep Medicine   NPI: [id number]                         [place], [facility]  ELECTRONICALLY SIGNED ON:  04/18/2017, 1:58 PM [place] PH: [phone]   FX: [phone] [place]

## 2017-04-25 ENCOUNTER — Encounter: Payer: 59 | Admitting: Family Medicine

## 2017-05-02 DIAGNOSIS — G4733 Obstructive sleep apnea (adult) (pediatric): Secondary | ICD-10-CM | POA: Insufficient documentation

## 2017-05-02 NOTE — Progress Notes (Signed)
Chief Complaint  Patient presents with  . Follow-up    on hypertension and sleep study. Was afraid to go up to full tablet of amlodipine even though she thinks she probably should have gone up-due to fatigue that she feels is from the medication. Is it okay to take ibuprofen with calcium channel blocker?    Patient presents for f/u on hypertension and sleep study  HTN:  Amlodipine 5mg  was started at her last visit in March. She started at 1/2 tablet and stayed there.  BP's have been running 114-126/83-89, pulse70's-80's. She reports feeling tired since taking it. The first three weeks she was more aware of her heart beating, mostly at night.  Not fast, but hard, just aware of it, lasted a few seconds. That resolved.   She was on her cycle the first week she took it, then she was sick the 2nd week. She felt tired all day long.  She no longer feels tired all day, but is having some tiredness. Having some daytime somnolence, but okay as long as she is active.  Denies headaches, dizziness, chest pain, edema. She admits she hasn't been watching the sodium in her diet--not making as good of choices.   Hasn't been exercising as much--walked a lot while in [place] (for robotics competition). She plans to go back to the gym 4x/week (elliptical for an hour, plans to start weights).  Allergies:  flonase was added to her oral antihistamine at last visit. She used it for at least 2 months, didn't think it helped much for allergies, or snoring, so she stopped.  She was referred for sleep study due to snoring and unrefreshed sleep. She presents to discuss the results.  She sleeps on her back with her head elevated, as she heard this can help with snoring.    IMPRESSIONS - Mild obstructive sleep apnea occurred during this study (AHI =  7.9/h). - No significant central sleep apnea occurred during this study  (CAI = 0.0/h). - Moderate oxygen desaturation was noted during this study (Min  O2 = 83%). Mean  95%. - Patient snored.  DIAGNOSIS - Obstructive Sleep Apnea (327.23 [G47.33 ICD-10])  RECOMMENDATIONS - Treatment for mild OSA is directed at symptoms. If conservative  measures are insufficient, then consider CPAP titration study,  DME/ autopap, or a fitted oral appliance. Other options would be  based on clinical judgment. - Avoid alcohol, sedatives and other CNS depressants that may  worsen sleep apnea and disrupt normal sleep architecture. - Sleep hygiene should be reviewed to assess factors that may  improve sleep quality. - Weight management and regular exercise should be initiated or  Continued.  PMH, PSH, SH reviewed  Outpatient Encounter Medications as of 05/03/2017  Medication Sig Note  . amLODipine (NORVASC) 2.5 MG tablet Take 1 tablet (2.5 mg total) by mouth daily.   . cholecalciferol (VITAMIN D) 1000 units tablet Take 2,000 Units daily by mouth.    . norethindrone-ethinyl estradiol (JUNEL FE,GILDESS FE,LOESTRIN FE) 1-20 MG-MCG tablet Take 1 tablet by mouth daily.   . [DISCONTINUED] amLODipine (NORVASC) 5 MG tablet Start at 1/2 tablet by mouth daily; increase to full after a week if BP remains elevated 05/03/2017: Still doing a half tablet  . acetaminophen (TYLENOL) 325 MG tablet Take 650 mg by mouth as needed for pain. Reported on 06/22/2015   . famotidine (PEPCID) 20 MG tablet Take 20 mg by mouth as needed for heartburn or indigestion.   . fexofenadine (ALLEGRA) 180 MG tablet Take 180  mg by mouth daily. Reported on 06/22/2015   . ibuprofen (ADVIL,MOTRIN) 200 MG tablet Take 200-400 mg by mouth daily. Reported on 06/22/2015   . [DISCONTINUED] fluticasone (FLONASE) 50 MCG/ACT nasal spray Place 2 sprays into both nostrils daily.    No facility-administered encounter medications on file as of 05/03/2017.    No Known Allergies  ROS: no fever, URI symptoms, headaches, dizziness.  +snoring and fatigue per HPI.  No chest pain, palpitation resolved. No heartburn, GI complaints.  No  rashes, edema or other concerns.   PHYSICAL EXAM:  BP 140/86   Pulse 80   Ht 5' 11.5" (1.816 m)   Wt 189 lb (85.7 kg)   BMI 25.99 kg/m  136/90 on repeat by MD  Wt Readings from Last 3 Encounters:  05/03/17 189 lb (85.7 kg)  03/19/17 187 lb (84.8 kg)  02/01/17 187 lb (84.8 kg)    Well appearing, pleasant female in no distress HEENT: EOMI, conjunctiva and sclera are clear. OP is clear Neck: no lymphadenopathy, thyromegaly or mass, no bruit Heart: regular rate and rhythm, no murmur Lungs: clear bilaterally Extremities: no edema, normal pulses Psych: normal mood, affect, hygiene and grooming Neuro: alert and oriented, cranial nerves intact, normal gait.   ASSESSMENT/PLAN:  Essential hypertension - improved with normal systolic at home, borderline diastolic. Cont 2.5mg  dose, resume exercise routine, low Na diet - Plan: amLODipine (NORVASC) 2.5 MG tablet  OSA (obstructive sleep apnea) - mild; while she can lose a few lbs, obesity is not a factor. Discussed oral appliance and CPAP in detail--start with dentist; snoring is her biggest concern  Snoring - discussed treatment options.  OSA treatments should help; also discussed ENT  30 min visit, more than 1/2 spent counseling   I would suggest talking to your dentist (or consider Dr. Toy Cookey if your dentist doesn't do this or have a recommended person for it) about an oral appliance.  That would be my first recommendation, if she felt it was appropriate.  We could consider a trial of CPAP, if needed.  Continue the 1/2 tablet of the amlodipine, and work on low sodium diet and regular exercise.   CPE November, sooner if any concerns or elevated blood pressures

## 2017-05-03 ENCOUNTER — Encounter: Payer: Self-pay | Admitting: Family Medicine

## 2017-05-03 ENCOUNTER — Ambulatory Visit (INDEPENDENT_AMBULATORY_CARE_PROVIDER_SITE_OTHER): Payer: 59 | Admitting: Family Medicine

## 2017-05-03 VITALS — BP 140/86 | HR 80 | Ht 71.5 in | Wt 189.0 lb

## 2017-05-03 DIAGNOSIS — G4733 Obstructive sleep apnea (adult) (pediatric): Secondary | ICD-10-CM | POA: Diagnosis not present

## 2017-05-03 DIAGNOSIS — R0683 Snoring: Secondary | ICD-10-CM

## 2017-05-03 DIAGNOSIS — I10 Essential (primary) hypertension: Secondary | ICD-10-CM | POA: Diagnosis not present

## 2017-05-03 MED ORDER — AMLODIPINE BESYLATE 2.5 MG PO TABS: 2.5000 mg | ORAL_TABLET | Freq: Every day | ORAL | 5 refills | Status: DC

## 2017-05-03 NOTE — Patient Instructions (Addendum)
I would suggest talking to your dentist (or consider Dr. Augustina Mood if your dentist doesn't do this or have a recommended person for it) about an oral appliance.  That would be my first recommendation, if she felt it was appropriate.  We could consider a trial of CPAP, if needed.  Continue the 1/2 tablet of the amlodipine, and work on low sodium diet and regular exercise.  Consider seeing and ENT if snoring remains a significant issue.    Sleep Apnea Sleep apnea is a condition in which breathing pauses or becomes shallow during sleep. Episodes of sleep apnea usually last 10 seconds or longer, and they may occur as many as 20 times an hour. Sleep apnea disrupts your sleep and keeps your body from getting the rest that it needs. This condition can increase your risk of certain health problems, including:  Heart attack.  Stroke.  Obesity.  Diabetes.  Heart failure.  Irregular heartbeat.  There are three kinds of sleep apnea:  Obstructive sleep apnea. This kind is caused by a blocked or collapsed airway.  Central sleep apnea. This kind happens when the part of the brain that controls breathing does not send the correct signals to the muscles that control breathing.  Mixed sleep apnea. This is a combination of obstructive and central sleep apnea.  What are the causes? The most common cause of this condition is a collapsed or blocked airway. An airway can collapse or become blocked if:  Your throat muscles are abnormally relaxed.  Your tongue and tonsils are larger than normal.  You are overweight.  Your airway is smaller than normal.  What increases the risk? This condition is more likely to develop in people who:  Are overweight.  Smoke.  Have a smaller than normal airway.  Are elderly.  Are female.  Drink alcohol.  Take sedatives or tranquilizers.  Have a family history of sleep apnea.  What are the signs or symptoms? Symptoms of this condition  include:  Trouble staying asleep.  Daytime sleepiness and tiredness.  Irritability.  Loud snoring.  Morning headaches.  Trouble concentrating.  Forgetfulness.  Decreased interest in sex.  Unexplained sleepiness.  Mood swings.  Personality changes.  Feelings of depression.  Waking up often during the night to urinate.  Dry mouth.  Sore throat.  How is this diagnosed? This condition may be diagnosed with:  A medical history.  A physical exam.  A series of tests that are done while you are sleeping (sleep study). These tests are usually done in a sleep lab, but they may also be done at home.  How is this treated? Treatment for this condition aims to restore normal breathing and to ease symptoms during sleep. It may involve managing health issues that can affect breathing, such as high blood pressure or obesity. Treatment may include:  Sleeping on your side.  Using a decongestant if you have nasal congestion.  Avoiding the use of depressants, including alcohol, sedatives, and narcotics.  Losing weight if you are overweight.  Making changes to your diet.  Quitting smoking.  Using a device to open your airway while you sleep, such as: ? An oral appliance. This is a custom-made mouthpiece that shifts your lower jaw forward. ? A continuous positive airway pressure (CPAP) device. This device delivers oxygen to your airway through a mask. ? A nasal expiratory positive airway pressure (EPAP) device. This device has valves that you put into each nostril. ? A bi-level positive airway pressure (BPAP) device.  This device delivers oxygen to your airway through a mask.  Surgery if other treatments do not work. During surgery, excess tissue is removed to create a wider airway.  It is important to get treatment for sleep apnea. Without treatment, this condition can lead to:  High blood pressure.  Coronary artery disease.  (Men) An inability to achieve or maintain an  erection (impotence).  Reduced thinking abilities.  Follow these instructions at home:  Make any lifestyle changes that your health care provider recommends.  Eat a healthy, well-balanced diet.  Take over-the-counter and prescription medicines only as told by your health care provider.  Avoid using depressants, including alcohol, sedatives, and narcotics.  Take steps to lose weight if you are overweight.  If you were given a device to open your airway while you sleep, use it only as told by your health care provider.  Do not use any tobacco products, such as cigarettes, chewing tobacco, and e-cigarettes. If you need help quitting, ask your health care provider.  Keep all follow-up visits as told by your health care provider. This is important. Contact a health care provider if:  The device that you received to open your airway during sleep is uncomfortable or does not seem to be working.  Your symptoms do not improve.  Your symptoms get worse. Get help right away if:  You develop chest pain.  You develop shortness of breath.  You develop discomfort in your back, arms, or stomach.  You have trouble speaking.  You have weakness on one side of your body.  You have drooping in your face. These symptoms may represent a serious problem that is an emergency. Do not wait to see if the symptoms will go away. Get medical help right away. Call your local emergency services (911 in the [place] Do not drive yourself to the hospital. This information is not intended to replace advice given to you by your health care provider. Make sure you discuss any questions you have with your health care provider. Document Released: 12/09/2001 Document Revised: 08/15/2015 Document Reviewed: 09/28/2014 Elsevier Interactive Patient Education  [employer].

## 2017-05-04 ENCOUNTER — Encounter: Payer: Self-pay | Admitting: Family Medicine

## 2017-05-04 DIAGNOSIS — I10 Essential (primary) hypertension: Secondary | ICD-10-CM | POA: Insufficient documentation

## 2017-05-23 DIAGNOSIS — D225 Melanocytic nevi of trunk: Secondary | ICD-10-CM | POA: Diagnosis not present

## 2017-05-23 DIAGNOSIS — D485 Neoplasm of uncertain behavior of skin: Secondary | ICD-10-CM | POA: Diagnosis not present

## 2017-05-23 DIAGNOSIS — Z85828 Personal history of other malignant neoplasm of skin: Secondary | ICD-10-CM | POA: Diagnosis not present

## 2017-05-23 DIAGNOSIS — D220 Melanocytic nevi of lip: Secondary | ICD-10-CM | POA: Diagnosis not present

## 2017-06-12 ENCOUNTER — Encounter: Payer: Self-pay | Admitting: Family Medicine

## 2017-09-26 DIAGNOSIS — Z23 Encounter for immunization: Secondary | ICD-10-CM | POA: Diagnosis not present

## 2017-11-02 ENCOUNTER — Other Ambulatory Visit: Payer: Self-pay | Admitting: Family Medicine

## 2017-11-02 DIAGNOSIS — I10 Essential (primary) hypertension: Secondary | ICD-10-CM

## 2017-12-04 ENCOUNTER — Other Ambulatory Visit: Payer: Self-pay | Admitting: Family Medicine

## 2017-12-04 DIAGNOSIS — I10 Essential (primary) hypertension: Secondary | ICD-10-CM

## 2018-01-31 ENCOUNTER — Other Ambulatory Visit: Payer: Self-pay | Admitting: Obstetrics & Gynecology

## 2018-02-07 ENCOUNTER — Ambulatory Visit (INDEPENDENT_AMBULATORY_CARE_PROVIDER_SITE_OTHER): Payer: 59 | Admitting: Obstetrics & Gynecology

## 2018-02-07 ENCOUNTER — Encounter: Payer: Self-pay | Admitting: Obstetrics & Gynecology

## 2018-02-07 ENCOUNTER — Encounter: Payer: 59 | Admitting: Obstetrics & Gynecology

## 2018-02-07 VITALS — BP 140/108 | Ht 71.5 in | Wt 195.0 lb

## 2018-02-07 DIAGNOSIS — Z1151 Encounter for screening for human papillomavirus (HPV): Secondary | ICD-10-CM

## 2018-02-07 DIAGNOSIS — I10 Essential (primary) hypertension: Secondary | ICD-10-CM

## 2018-02-07 DIAGNOSIS — Z3041 Encounter for surveillance of contraceptive pills: Secondary | ICD-10-CM

## 2018-02-07 DIAGNOSIS — Z01419 Encounter for gynecological examination (general) (routine) without abnormal findings: Secondary | ICD-10-CM

## 2018-02-07 MED ORDER — NORETHINDRONE 0.35 MG PO TABS
1.0000 | ORAL_TABLET | Freq: Every day | ORAL | 4 refills | Status: DC
Start: 2018-02-07 — End: 2019-03-12

## 2018-02-07 NOTE — Progress Notes (Signed)
[name] 1965/11/06 [id number]   History:    53 y.o. G1P1L1 Married.  Son is 63 yo Equities trader at [employer] at [employer]  RP:  Established patient presenting for annual gyn exam   HPI: Well on Junel Fe 1/20.  No breakthrough bleeding.  No pelvic pain.  No pain with intercourse.  Breast normal.  cHTN on Norvasc 2.5 mg daily.  Her father died fall 2017/04/12, since then not following her low salt healthy diet as well as before and stopped exercising on a regular basis.  Body mass index 26.82.  Health labs with family physician.  Past medical history,surgical history, family history and social history were all reviewed and documented in the EPIC chart.  Gynecologic History Patient's last menstrual period was 01/17/2018. Contraception: OCP (estrogen/progesterone) Last Pap: 2015/04/13. Results were: normal Last mammogram: 2017/04/12. Results were: Negative Bone Density: Never Colonoscopy: Will schedule through her Fam MD  Obstetric History OB History  Gravida Para Term Preterm AB Living  1 1       1   SAB TAB Ectopic Multiple Live Births               # Outcome Date GA Lbr Len/2nd Weight Sex Delivery Anes PTL Lv  1 Para              ROS: A ROS was performed and pertinent positives and negatives are included in the history.  GENERAL: No fevers or chills. HEENT: No change in vision, no earache, sore throat or sinus congestion. NECK: No pain or stiffness. CARDIOVASCULAR: No chest pain or pressure. No palpitations. PULMONARY: No shortness of breath, cough or wheeze. GASTROINTESTINAL: No abdominal pain, nausea, vomiting or diarrhea, melena or bright red blood per rectum. GENITOURINARY: No urinary frequency, urgency, hesitancy or dysuria. MUSCULOSKELETAL: No joint or muscle pain, no back pain, no recent trauma. DERMATOLOGIC: No rash, no itching, no lesions. ENDOCRINE: No polyuria, polydipsia, no heat or cold intolerance. No recent change in weight. HEMATOLOGICAL: No anemia or easy bruising or bleeding.  NEUROLOGIC: No headache, seizures, numbness, tingling or weakness. PSYCHIATRIC: No depression, no loss of interest in normal activity or change in sleep pattern.     Exam:   BP (!) 154/110   Ht 5' 11.5" (1.816 m)   Wt 195 lb (88.5 kg)   LMP 01/17/2018   BMI 26.82 kg/m   Body mass index is 26.82 kg/m.  General appearance : Well developed well nourished female. No acute distress HEENT: Eyes: no retinal hemorrhage or exudates,  Neck supple, trachea midline, no carotid bruits, no thyroidmegaly Lungs: Clear to auscultation, no rhonchi or wheezes, or rib retractions  Heart: Regular rate and rhythm, no murmurs or gallops Breast:Examined in sitting and supine position were symmetrical in appearance, no palpable masses or tenderness,  no skin retraction, no nipple inversion, no nipple discharge, no skin discoloration, no axillary or supraclavicular lymphadenopathy Abdomen: no palpable masses or tenderness, no rebound or guarding Extremities: no edema or skin discoloration or tenderness  Pelvic: Vulva: Normal             Vagina: No gross lesions or discharge  Cervix: No gross lesions or discharge  Uterus  AV, normal size, shape and consistency, non-tender and mobile  Adnexa  Without masses or tenderness  Anus: Normal   Assessment/Plan:  53 y.o. female for annual exam   1. Encounter for routine gynecological examination with Papanicolaou smear of cervix Normal gynecologic exam.  Pap with high-risk HPV done today.  Breast  exam normal.  Last screening mammogram March 2019 was negative.  Patient will schedule a screening colonoscopy through her family physician.  Health labs with family physician.  Body mass index 26.82.  Strongly recommend to start back on regular fitness activities with aerobic activities 5 times a week and weightlifting every 2 days.  Low-salt diet with plenty of vegetables.  2. Encounter for surveillance of contraceptive pills Given chronic hypertension currently not well  controlled, decision to stop estrogen/progesterone birth control pills and switch to a progesterone only birth control pill.  Usage reviewed.  Importance of good compliance discussed.  Prescription sent to pharmacy.  3. Essential hypertension Blood pressure high today at 154/110 and then taken again at 140/108.  Recommend calling her family physician for recommendations and evaluation now.    Other orders - norethindrone (MICRONOR,CAMILA,ERRIN) 0.35 MG tablet; Take 1 tablet (0.35 mg total) by mouth daily.  [name] MD, 12:31 PM 02/07/2018

## 2018-02-08 ENCOUNTER — Encounter: Payer: Self-pay | Admitting: Obstetrics & Gynecology

## 2018-02-08 NOTE — Patient Instructions (Signed)
1. Encounter for routine gynecological examination with Papanicolaou smear of cervix Normal gynecologic exam.  Pap with high-risk HPV done today.  Breast exam normal.  Last screening mammogram March 2019 was negative.  Patient will schedule a screening colonoscopy through her family physician.  Health labs with family physician.  Body mass index 26.82.  Strongly recommend to start back on regular fitness activities with aerobic activities 5 times a week and weightlifting every 2 days.  Low-salt diet with plenty of vegetables.  2. Encounter for surveillance of contraceptive pills Given chronic hypertension currently not well controlled, decision to stop estrogen/progesterone birth control pills and switch to a progesterone only birth control pill.  Usage reviewed.  Importance of good compliance discussed.  Prescription sent to pharmacy.  3. Essential hypertension Blood pressure high today at 154/110 and then taken again at 140/108.  Recommend calling her family physician for recommendations and evaluation now.    Other orders - norethindrone (MICRONOR,CAMILA,ERRIN) 0.35 MG tablet; Take 1 tablet (0.35 mg total) by mouth daily.  [name], it was a pleasure seeing you today!  I will inform you of your results as soon as they are available.

## 2018-02-11 LAB — PAP, TP IMAGING W/ HPV RNA, RFLX HPV TYPE 16,18/45: HPV DNA High Risk: NOT DETECTED

## 2018-02-28 ENCOUNTER — Other Ambulatory Visit: Payer: Self-pay | Admitting: Obstetrics & Gynecology

## 2018-02-28 DIAGNOSIS — Z1231 Encounter for screening mammogram for malignant neoplasm of breast: Secondary | ICD-10-CM

## 2018-03-04 ENCOUNTER — Ambulatory Visit
Admission: RE | Admit: 2018-03-04 | Discharge: 2018-03-04 | Disposition: A | Discharge status: Home / Self Care | Payer: 59 | Source: Ambulatory Visit

## 2018-03-04 DIAGNOSIS — Z1231 Encounter for screening mammogram for malignant neoplasm of breast: Secondary | ICD-10-CM | POA: Diagnosis not present

## 2018-03-19 ENCOUNTER — Encounter: Payer: Self-pay | Admitting: Family Medicine

## 2018-04-15 NOTE — Progress Notes (Signed)
Spoke with patient and since her GYN visit she has been walking every day and her bp readings have been all under 120/75. She said she would call if anything changes.

## 2018-05-19 ENCOUNTER — Encounter: Payer: Self-pay | Admitting: Family Medicine

## 2018-05-19 DIAGNOSIS — I10 Essential (primary) hypertension: Secondary | ICD-10-CM

## 2018-05-19 MED ORDER — AMLODIPINE BESYLATE 2.5 MG PO TABS: 2.5000 mg | ORAL_TABLET | Freq: Every day | ORAL | 0 refills | Status: DC

## 2018-05-29 ENCOUNTER — Other Ambulatory Visit: Payer: Self-pay | Admitting: Family Medicine

## 2018-05-29 DIAGNOSIS — I10 Essential (primary) hypertension: Secondary | ICD-10-CM

## 2018-05-29 MED ORDER — AMLODIPINE BESYLATE 2.5 MG PO TABS: 2.5000 mg | ORAL_TABLET | Freq: Every day | ORAL | 1 refills | Status: DC

## 2018-06-13 ENCOUNTER — Other Ambulatory Visit: Payer: Self-pay | Admitting: Family Medicine

## 2018-06-13 DIAGNOSIS — I10 Essential (primary) hypertension: Secondary | ICD-10-CM

## 2018-06-16 NOTE — Progress Notes (Signed)
Chief Complaint  Patient presents with  . Hypertension    med check. No new concerns. Did want to touch base on her ankle swelling-is better but when to be concerned.     Patient presents for f/u on hypertension. She continues on amlodipine 2.59m daily, started in 03/2017.  Last seen in office over a year ago.  She contacted [facility] March 2020 when having some swelling at her ankles (noticed sock marks/indentations).  She seems to notice it more on her right leg, but the time she contacted [facility] when she noticed it on both feet/ankles. Recalls seeing it even prior to being on amlodipine. Swelling has never been significant, and seems somewhat focal.  We contacted her about BP after noting that it was elevated at her GYN visit.  She reported in April that since her GYN visit she had been walking daily and BP's had been much better, all under 120/75.  She checks BP regularly at home, forgot to bring her monitor to verify the accuracy. Brings in list of BP's, showing that in the last couple of months her BP has been  102-125/67-83, mostly 115-120/mid 70's  Eating more since home (coronavirus pandemic), sometimes higher in sodium. Tries to limit the sodium in her diet.   Exercise: walking 1.5-2 hours 5x/week or more. Some weightbearing activity with laundry bottles.  She has previously complained of fatigue.  Sleep study was done last year (due to snoring and unrefreshed seep).  Study showed mild OSA (AHI 7.9).  We had discussed treatment options last year, and recommended talking to her dentist about oral appliance. Dr. FToy Cookeywasn't on her insurance. Finally found someone to see to pursue the oral appliance, but then offices shut down due to COVID-19. She plans to contact.  Over the last couple of weeks she has had some increased fatigue late in the day (falls asleep easily around 3pm). It otherwise hadn't been as bad. Thinks vitamin D helped with energy.  Allergies:  She takes Allegra daily, which  helps.  Didn't notice much benefit from Flonase in the past, so didn't continue to use it.  She was started on progesterone-only pill by her GYN. She has some questions related to risk of pregnancy.  She takes pill within an hour of the same time daily, no missed pills or abnormal bleeding.   PMH, PSH, SH reviewed  Outpatient Encounter Medications as of 06/17/2018  Medication Sig  . amLODipine (NORVASC) 2.5 MG tablet Take 1 tablet (2.5 mg total) by mouth daily.  . cholecalciferol (VITAMIN D) 1000 units tablet Take 2,000 Units daily by mouth.   . fexofenadine (ALLEGRA) 180 MG tablet Take 180 mg by mouth daily. Reported on 06/22/2015  . norethindrone (MICRONOR,CAMILA,ERRIN) 0.35 MG tablet Take 1 tablet (0.35 mg total) by mouth daily.  .Marland Kitchenacetaminophen (TYLENOL) 325 MG tablet Take 650 mg by mouth as needed for pain. Reported on 06/22/2015  . famotidine (PEPCID) 20 MG tablet Take 20 mg by mouth as needed for heartburn or indigestion.  . [DISCONTINUED] ibuprofen (ADVIL,MOTRIN) 200 MG tablet Take 200-400 mg by mouth daily. Reported on 06/22/2015  . [DISCONTINUED] norethindrone (MICRONOR) 0.35 MG tablet    No facility-administered encounter medications on file as of 06/17/2018.    No Known Allergies   ROS:  No fever, chills, URI symptoms, cough, shortness of breath, bowel changes, bleeding, bruising, rash. She denies headaches, dizziness, chest pain, palpitations. Fatigue periodically in the afternoon. Allergies are controlled. See HPI.   PHYSICAL EXAM:  BP 140/90   Pulse 76   Temp 98.6 F (37 C) (Temporal)   Ht 5' 11.5" (1.816 m)   Wt 174 lb 9.6 oz (79.2 kg)   LMP 06/06/2018 (Approximate)   BMI 24.01 kg/m   124/84 on repeat by MD  Wt Readings from Last 3 Encounters:  02/07/18 195 lb (88.5 kg)  05/03/17 189 lb (85.7 kg)  03/19/17 187 lb (84.8 kg)   Well developed, pleasant female, in good spirits, in no distress.  (she reports feeling rushed upon arrival, feels calmer when BP  repeated). HEENT: conjunctiva and sclera are clear, EOMI Remainder of ENT not performed, wearing mask due to COVID-19 Neck: slightly prominent LN (vs gland) at angle of jaw bilaterally symmetric and nontender. No thyromegaly or mass. No carotid bruit Heart: regular rate and rhythm, no murmur Lungs: clear bilaterally Abdomen: soft, nontender, no mass Extremities: no edema, normal pulses Psych: normal mood, affect, hygiene and grooming Neuro: alert and oriented, normal gait, strength    ASSESSMENT/PLAN:  Essential hypertension - well controlled; discussed bringing monitor to verify accuracy (through repeat BP today was close to home values). Cont amlodipine - Plan: Lipid panel, Comprehensive metabolic panel, amLODipine (NORVASC) 2.5 MG tablet,  OSA (obstructive sleep apnea) - encouraged f/u with dentist to discuss oral appliance. avoid sleeping on back   Colon cancer screening - refer due GI for routine colonoscopy due to being age 53 - Plan: Ambulatory referral to Gastroenterology  Vitamin D deficiency - some recurrent fatigue, so will recheck level - Plan: VITAMIN D 25 Hydroxy (Vit-D Deficiency, Fractures)  Essential hypertension - improved with normal systolic at home, borderline diastolic. Cont 2.79m dose, resume exercise routine, low Na diet - Plan: Lipid panel, Comprehensive metabolic panel, amLODipine (NORVASC) 2.5 MG tablet   c-met, lipid, D  F/u as scheduled for CPE in 11/2018 Can bring monitor to visit (vs NV sooner, per pt preference)  Pt should check on Shingrix coverage with insurance. Risks/SE reviewed; can schedule NV or can wait until CPE. Discussed in detail.

## 2018-06-17 ENCOUNTER — Encounter: Payer: Self-pay | Admitting: Family Medicine

## 2018-06-17 ENCOUNTER — Other Ambulatory Visit: Payer: Self-pay

## 2018-06-17 ENCOUNTER — Ambulatory Visit (INDEPENDENT_AMBULATORY_CARE_PROVIDER_SITE_OTHER): Payer: 59 | Admitting: Family Medicine

## 2018-06-17 VITALS — BP 124/84 | HR 76 | Temp 98.6°F | Ht 71.5 in | Wt 174.6 lb

## 2018-06-17 DIAGNOSIS — G4733 Obstructive sleep apnea (adult) (pediatric): Secondary | ICD-10-CM | POA: Diagnosis not present

## 2018-06-17 DIAGNOSIS — Z1211 Encounter for screening for malignant neoplasm of colon: Secondary | ICD-10-CM | POA: Diagnosis not present

## 2018-06-17 DIAGNOSIS — I10 Essential (primary) hypertension: Secondary | ICD-10-CM | POA: Diagnosis not present

## 2018-06-17 DIAGNOSIS — E559 Vitamin D deficiency, unspecified: Secondary | ICD-10-CM | POA: Diagnosis not present

## 2018-06-17 MED ORDER — AMLODIPINE BESYLATE 2.5 MG PO TABS: 2.5000 mg | ORAL_TABLET | Freq: Every day | ORAL | 1 refills | Status: DC

## 2018-06-17 NOTE — Patient Instructions (Addendum)
Consider scheduling a nurse visit so that we can assess the accuracy of your blood pressure monitor. Your blood pressure at home remains excellent.  Continue the 2.5mg  amlodipine.  Continue low sodium diet and regular exercise.  Consider compression socks/stockings if standing or sitting for prolonged periods of time and if you notice swelling in the feet.  I recommend getting the new shingles vaccine (Shingrix). You will need to check with your insurance to see if it is covered, and if covered get at your physical in November or you can schedule a nurse visit to start the series soon.   It is a series of 2 injections, spaced 2 months apart.  Please follow up on the oral appliance for mild sleep apnea.

## 2018-06-18 LAB — COMPREHENSIVE METABOLIC PANEL
ALT: 26 IU/L (ref 0–32)
AST: 26 IU/L (ref 0–40)
Albumin/Globulin Ratio: 2.4 — ABNORMAL HIGH (ref 1.2–2.2)
Albumin: 4.6 g/dL (ref 3.8–4.8)
Alkaline Phosphatase: 92 IU/L (ref 39–117)
BUN/Creatinine Ratio: 11 (ref 9–23)
BUN: 9 mg/dL (ref 6–24)
Bilirubin Total: 0.5 mg/dL (ref 0.0–1.2)
CO2: 23 mmol/L (ref 20–29)
Calcium: 9.3 mg/dL (ref 8.7–10.2)
Chloride: 105 mmol/L (ref 96–106)
Creatinine, Ser: 0.8 mg/dL (ref 0.57–1.00)
GFR calc Af Amer: 99 mL/min/{1.73_m2} (ref >59)
GFR calc non Af Amer: 86 mL/min/{1.73_m2} (ref >59)
Globulin, Total: 1.9 g/dL (ref 1.5–4.5)
Glucose: 78 mg/dL (ref 65–99)
Potassium: 4.8 mmol/L (ref 3.5–5.2)
Sodium: 142 mmol/L (ref 134–144)
Total Protein: 6.5 g/dL (ref 6.0–8.5)

## 2018-06-18 LAB — LIPID PANEL
Chol/HDL Ratio: 3.4 ratio (ref 0.0–4.4)
Cholesterol, Total: 173 mg/dL (ref 100–199)
HDL: 51 mg/dL (ref >39)
LDL Calculated: 108 mg/dL — ABNORMAL HIGH (ref 0–99)
Triglycerides: 68 mg/dL (ref 0–149)
VLDL Cholesterol Cal: 14 mg/dL (ref 5–40)

## 2018-06-18 LAB — VITAMIN D 25 HYDROXY (VIT D DEFICIENCY, FRACTURES): Vit D, 25-Hydroxy: 33.1 ng/mL (ref 30.0–100.0)

## 2018-07-18 ENCOUNTER — Encounter: Payer: Self-pay | Admitting: Family Medicine

## 2018-08-20 ENCOUNTER — Encounter: Payer: Self-pay | Admitting: Family Medicine

## 2018-09-25 ENCOUNTER — Encounter: Payer: Self-pay | Admitting: Gynecology

## 2018-11-11 ENCOUNTER — Encounter: Payer: 59 | Admitting: Family Medicine

## 2018-12-03 NOTE — Progress Notes (Signed)
Chief Complaint  Patient presents with  . Chest Pain    on the left side x 2 weeks maybe slightly longer. Always starts the breast area can last for a few hours. Can be sore in other areas. Moves under her arm-different areas and can be in more than one spot at one time. Thinks it may be related to hormones-starts about a week before her period. Told she has dense breast tissue.   . Other    brought ENT note-has question about nasal spray. Tried Flonase and didn't work. Wanted to know if Nasacort would be a good choice to try or if you have a recommendation?   Patient presents for evaluation of left-sided chest pain over the last couple of weeks.  The pain started the week prior to her last period, which was LMP 11/23.  She has had no pain yesterday or today  Pain starts at the left medial breast, can go up to the clavicle, and sometimes to the axilla. She had some discomfort on the left side of her back between the shoulder blade and spine for a day.  She used a tennis ball, which helped. She is also having some neck pain, related to her computer position (to the left).  She took tylenol just 2 times.  She had similar issues in the past.  Thinks it causes some stress. Worries about her heart, knowing that women can have atypical symptoms.  She hasn't been walking as much over the past week, due to the chest pain concerns.  She normally walks several times/week, for about 4 miles.  Denies chest pain, palpitations, DOE (only sometimes, due to not being able to breathe through her nose).  Patient has hypertension, and is compliant with taking amlodipine 2.5mg  daily.  BP's have been running 117/uper 70's.  This morning it was 118/77, and each time she took it, it went up, up to 130/80  She has previously complained of fatigue.  Sleep study was done 05/2017 (due to snoring and unrefreshed seep).  Study showed mild OSA (AHI 7.9).  We had discussed treatment options last year, and recommended talking  to her dentist about oral appliance. Dr. Toy Cookey wasn't on her insurance. Finally found someone to see to pursue the oral appliance, but then offices shut down due to COVID-19.  She talked about it with her dentist.  She decided to see ENT to see if there was anything else.  She saw Dr. Benjamine Mola, and was told she has deviated septum and enlarged turbinates, 95% blockage of nasal passage. He recommended septoplasty and turbinate reduction. She is asking if this will help with sleep apnea, never really got answer from him. Office experience wasn't the best (long wait, didn't feel like all questions were answered adequately, felt a little rushed).  She has tried Flonase in the past (both kinds, including sensimist), asking if it is worth trying a different inhaled steroid.   PMH, PSH, SH reviewed  Outpatient Encounter Medications as of 12/04/2018  Medication Sig  . amLODipine (NORVASC) 2.5 MG tablet Take 1 tablet (2.5 mg total) by mouth daily.  . cholecalciferol (VITAMIN D) 1000 units tablet Take 2,000 Units daily by mouth.   . fexofenadine (ALLEGRA) 180 MG tablet Take 180 mg by mouth daily. Reported on 06/22/2015  . norethindrone (MICRONOR,CAMILA,ERRIN) 0.35 MG tablet Take 1 tablet (0.35 mg total) by mouth daily.  Marland Kitchen acetaminophen (TYLENOL) 325 MG tablet Take 650 mg by mouth as needed for pain. Reported on 06/22/2015  .  famotidine (PEPCID) 20 MG tablet Take 20 mg by mouth as needed for heartburn or indigestion.   No facility-administered encounter medications on file as of 12/04/2018.    No Known Allergies  ROS: no fever, chills, headaches.  Neck pain and chest pain per HPI.  +nasal congestion per HPI.  Denies sore throat, cough, GI complaints, bleeding, bruising, rash or other concern. Admits to some anxiety regarding her symptoms   PHYSICAL EXAM:  BP 130/88   Pulse 80   Temp (!) 97.5 F (36.4 C) (Other (Comment))   Ht 5' 11.5" (1.816 m)   Wt 180 lb 6.4 oz (81.8 kg)   LMP 11/25/2018 Comment:  shorter cycle than normal-4 days  BMI 24.81 kg/m   Wt Readings from Last 3 Encounters:  12/04/18 180 lb 6.4 oz (81.8 kg)  06/17/18 174 lb 9.6 oz (79.2 kg)  02/07/18 195 lb (88.5 kg)   128/83 LA patient's monitor 120/84 on repeat LA by MD (regular size cuff).  Well-appearing, slightly anxious, talkative female, in no distress HEENT: conjunctiva and sclera are clear, EOMI.  Wearing mask. Nose not examined. Neck: no lymphadenopathy, thyromegaly or mass Heart: regular rate and rhythm, no murmur Lungs: clear bilaterally Abdomen: soft, nontender, no organomegaly or mass Chest wall: no costochondral tenderness. No chest wall tenderness She has fibroglandular changes diffusely throughout her left breast, not particularly tender.  No axillary lymphadenopathy Neuro: alert and oriented, normal strength, gait Psych: slightly anxious, normal eye contact, speech, grooming   ASSESSMENT/PLAN:  Chest pain, unspecified type - reassured.  CXR and/or EKG if persistent symptoms. NSAIDs and warm compresses if becomes tender.  Fibrocystic changes of left breast - discussed affect of caffeine/chocolate on breasts.  Mammo normal in March  Nasal congestion  Nasal septal deviation   CXR if worse Declines EKG today, asymptomatic currently  Discussed nasal steroids, how to properly use, the potential benefit (and being somewhat limited on the more obstructed side. Briefly discussed the recommended surgeries--they may not treat sleep apnea, but give her more options for CPAP treatments (would need full mask currently, vs other options after surgery). Discussed Dr. Benjamine Mola (excellent surgeon), also discussed other options for 2nd opinion, given her interaction at office wasn't optimal.  Dr. Wilburn Cornelia recommended also.  F/u at CPE in April (She cancelled her CPE that was supposed to be in November, doesn't desire sooner appt since labs done in June).   Over 30 min visit, more than 1/2 spent counseling and  answering her many questions.

## 2018-12-04 ENCOUNTER — Other Ambulatory Visit: Payer: Self-pay

## 2018-12-04 ENCOUNTER — Encounter: Payer: Self-pay | Admitting: Family Medicine

## 2018-12-04 ENCOUNTER — Ambulatory Visit (INDEPENDENT_AMBULATORY_CARE_PROVIDER_SITE_OTHER): Payer: 59 | Admitting: Family Medicine

## 2018-12-04 VITALS — BP 120/84 | HR 80 | Temp 97.5°F | Ht 71.5 in | Wt 180.4 lb

## 2018-12-04 DIAGNOSIS — R079 Chest pain, unspecified: Secondary | ICD-10-CM | POA: Diagnosis not present

## 2018-12-04 DIAGNOSIS — J342 Deviated nasal septum: Secondary | ICD-10-CM | POA: Diagnosis not present

## 2018-12-04 DIAGNOSIS — N6012 Diffuse cystic mastopathy of left breast: Secondary | ICD-10-CM | POA: Diagnosis not present

## 2018-12-04 DIAGNOSIS — R0981 Nasal congestion: Secondary | ICD-10-CM | POA: Diagnosis not present

## 2018-12-04 NOTE — Patient Instructions (Signed)
Try and limit caffeine and chocolate to minimize and fibroglandular discomfort related to your breasts and cycles. Your physical exam was normal today. Please contact us if you have recurrent issues, or if new symptoms develop.

## 2019-01-06 ENCOUNTER — Other Ambulatory Visit: Payer: Self-pay | Admitting: Obstetrics & Gynecology

## 2019-01-06 DIAGNOSIS — Z1231 Encounter for screening mammogram for malignant neoplasm of breast: Secondary | ICD-10-CM

## 2019-01-28 ENCOUNTER — Encounter: Payer: Self-pay | Admitting: Family Medicine

## 2019-02-02 ENCOUNTER — Other Ambulatory Visit: Payer: Self-pay | Admitting: Family Medicine

## 2019-02-02 DIAGNOSIS — I10 Essential (primary) hypertension: Secondary | ICD-10-CM

## 2019-02-07 ENCOUNTER — Telehealth: Payer: 59 | Admitting: Nurse Practitioner

## 2019-02-07 DIAGNOSIS — B3731 Acute candidiasis of vulva and vagina: Secondary | ICD-10-CM

## 2019-02-07 DIAGNOSIS — B373 Candidiasis of vulva and vagina: Secondary | ICD-10-CM | POA: Diagnosis not present

## 2019-02-07 MED ORDER — FLUCONAZOLE 150 MG PO TABS: 150.0000 mg | ORAL_TABLET | Freq: Every day | ORAL | 0 refills | Status: AC

## 2019-02-07 NOTE — Progress Notes (Signed)
We are sorry that you are not feeling well. Here is how we plan to help! Based on what you shared with me it looks like you: May have a yeast vaginosis.  There is some question as to whether this is also bacterial vaginosis. Symptoms of vaginosis do not include itching and you would have a "foul or fishy" vaginal odor.  In the interim, I am going to treat you with medications for  yeast vaginosis.   Vaginosis is an inflammation of the vagina that can result in discharge, itching and pain. The cause is usually a change in the normal balance of vaginal bacteria or an infection. Vaginosis can also result from reduced estrogen levels after menopause.  The most common causes of vaginosis are:   Bacterial vaginosis which results from an overgrowth of one on several organisms that are normally present in your vagina.   Yeast infections which are caused by a naturally occurring fungus called candida.   Vaginal atrophy (atrophic vaginosis) which results from the thinning of the vagina from reduced estrogen levels after menopause.   Trichomoniasis which is caused by a parasite and is commonly transmitted by sexual intercourse.  Factors that increase your risk of developing vaginosis include: Marland Kitchen Medications, such as antibiotics and steroids . Uncontrolled diabetes . Use of hygiene products such as bubble bath, vaginal spray or vaginal deodorant . Douching . Wearing damp or tight-fitting clothing . Using an intrauterine device (IUD) for birth control . Hormonal changes, such as those associated with pregnancy, birth control pills or menopause . Sexual activity . Having a sexually transmitted infection  Your treatment plan is A single Diflucan (fluconazole) 150mg  tablet once.  I have electronically sent this prescription into the pharmacy that you have chosen.   Be sure to take all of the medication as directed. IIf your symptoms do not improve with this medication, please see your PCP or GYN as soon  as possible.  Stop taking any medication if you develop a rash, tongue swelling or shortness of breath. Mothers who are breast feeding should consider pumping and discarding their breast milk while on these antibiotics. However, there is no consensus that infant exposure at these doses would be harmful.  Remember that medication creams can weaken latex condoms. Marland Kitchen   HOME CARE:  Good hygiene may prevent some types of vaginosis from recurring and may relieve some symptoms:  . Avoid baths, hot tubs and whirlpool spas. Rinse soap from your outer genital area after a shower, and dry the area well to prevent irritation. Don't use scented or harsh soaps, such as those with deodorant or antibacterial action. Marland Kitchen Avoid irritants. These include scented tampons and pads. . Wipe from front to back after using the toilet. Doing so avoids spreading fecal bacteria to your vagina.  Other things that may help prevent vaginosis include:  Marland Kitchen Don't douche. Your vagina doesn't require cleansing other than normal bathing. Repetitive douching disrupts the normal organisms that reside in the vagina and can actually increase your risk of vaginal infection. Douching won't clear up a vaginal infection. . Use a latex condom. Both female and female latex condoms may help you avoid infections spread by sexual contact. . Wear cotton underwear. Also wear pantyhose with a cotton crotch. If you feel comfortable without it, skip wearing underwear to bed. Yeast thrives in [employer] Your symptoms should improve in the next day or two.  GET HELP RIGHT AWAY IF:  . You have pain in your lower abdomen (  pelvic area or over your ovaries) . You develop nausea or vomiting . You develop a fever . Your discharge changes or worsens . You have persistent pain with intercourse . You develop shortness of breath, a rapid pulse, or you faint.  These symptoms could be signs of problems or infections that need to be evaluated by a  medical provider now.  MAKE SURE YOU    Understand these instructions.  Will watch your condition.  Will get help right away if you are not doing well or get worse.  Your e-visit answers were reviewed by a board certified advanced clinical practitioner to complete your personal care plan. Depending upon the condition, your plan could have included both over the counter or prescription medications. Please review your pharmacy choice to make sure that you have choses a pharmacy that is open for you to pick up any needed prescription, Your safety is important to [place]. If you have drug allergies check your prescription carefully.   You can use MyChart to ask questions about today's visit, request a non-urgent call back, or ask for a work or school excuse for 24 hours related to this e-Visit. If it has been greater than 24 hours you will need to follow up with your provider, or enter a new e-Visit to address those concerns. You will get a MyChart message within the next two days asking about your experience. I hope that your e-visit has been valuable and will speed your recovery.  I have spent at least 5 minutes reviewing and documenting in the patient's chart.

## 2019-02-11 ENCOUNTER — Encounter: Payer: Self-pay | Admitting: Family Medicine

## 2019-02-25 ENCOUNTER — Telehealth: Payer: Self-pay

## 2019-02-25 ENCOUNTER — Other Ambulatory Visit: Payer: Self-pay

## 2019-02-25 NOTE — Telephone Encounter (Signed)
Patient called stating she knows she probably needs office visit. Persistent vaginal infection sx after Rx and now with some pelvic pinching sensations. Recommended office visit. Butch Penny will call her and let her know and schedule appt.

## 2019-02-26 ENCOUNTER — Ambulatory Visit (INDEPENDENT_AMBULATORY_CARE_PROVIDER_SITE_OTHER): Payer: 59 | Admitting: Obstetrics & Gynecology

## 2019-02-26 ENCOUNTER — Encounter: Payer: Self-pay | Admitting: Obstetrics & Gynecology

## 2019-02-26 VITALS — BP 120/78

## 2019-02-26 DIAGNOSIS — Z113 Encounter for screening for infections with a predominantly sexual mode of transmission: Secondary | ICD-10-CM | POA: Diagnosis not present

## 2019-02-26 DIAGNOSIS — R102 Pelvic and perineal pain: Secondary | ICD-10-CM | POA: Diagnosis not present

## 2019-02-26 DIAGNOSIS — N898 Other specified noninflammatory disorders of vagina: Secondary | ICD-10-CM | POA: Diagnosis not present

## 2019-02-26 LAB — URINALYSIS, COMPLETE W/RFL CULTURE
Bacteria, UA: NONE SEEN /HPF
Bilirubin Urine: NEGATIVE
Glucose, UA: NEGATIVE
Hgb urine dipstick: NEGATIVE
Hyaline Cast: NONE SEEN /LPF
Ketones, ur: NEGATIVE
Leukocyte Esterase: NEGATIVE
Nitrites, Initial: NEGATIVE
Protein, ur: NEGATIVE
RBC / HPF: NONE SEEN /HPF (ref 0–2)
Specific Gravity, Urine: 1.02 (ref 1.001–1.03)
WBC, UA: NONE SEEN /HPF (ref 0–5)
pH: 7 (ref 5.0–8.0)

## 2019-02-26 LAB — WET PREP FOR TRICH, YEAST, CLUE

## 2019-02-26 LAB — NO CULTURE INDICATED

## 2019-02-26 NOTE — Progress Notes (Signed)
    [name] [birth year] [id number]        53 y.o.  G1P1L1  RP: Vaginal irritation x about 2 weeks  HPI: Vaginal irritation with vaginal discharge on-off x 2 weeks.  Treated with 1 tab of Fluconazole about 1 week ago.  On the Progestin-only BCP.  No abnormal vaginal bleeding.  Mild pelvic discomfort, no UTI Sxs otherwise.  No fever.   OB History  Gravida Para Term Preterm AB Living  1 1       1   SAB TAB Ectopic Multiple Live Births               # Outcome Date GA Lbr Len/2nd Weight Sex Delivery Anes PTL Lv  1 Para             Past medical history,surgical history, problem list, medications, allergies, family history and social history were all reviewed and documented in the EPIC chart.   Directed ROS with pertinent positives and negatives documented in the history of present illness/assessment and plan.  Exam:  Vitals:   02/26/19 1104  BP: 120/78   General appearance:  Normal  Abdomen: Normal CVAT Negative bilaterally  Gynecologic exam: Vulva normal.  Speculum: Cervix/Vagina normal.  Mild increase in vaginal d/c.  Wet prep done.  Gono-Chlam done.  Bimanual exam:  Uterus AV, normal size, mobile, NT.  No adnexal mass felt, NT.  Wet prep: Negative U/A: Negative   Assessment/Plan:  54 y.o. G1P1   1. Vaginal irritation Wet prep negative.  Will observe.  Yogourt/Probiotic to try.  Call back if worsening symptoms. - WET PREP FOR [place], YEAST, CLUE - Gono-Chlam pending  2. Pelvic pain in female Normal gynecologic exam. U/A Negative. - Urinalysis,Complete w/RFL Culture  [name] MD, 11:10 AM 02/26/2019

## 2019-02-27 LAB — GC PROBE AMP THINPREP: N. gonorrhoeae RNA, TMA: NOT DETECTED

## 2019-02-27 LAB — CHLAMYDIA PROBE AMP THINPREP: C. trachomatis RNA, TMA: NOT DETECTED

## 2019-03-01 ENCOUNTER — Other Ambulatory Visit: Payer: Self-pay | Admitting: Family Medicine

## 2019-03-01 ENCOUNTER — Encounter: Payer: Self-pay | Admitting: Obstetrics & Gynecology

## 2019-03-01 DIAGNOSIS — I10 Essential (primary) hypertension: Secondary | ICD-10-CM

## 2019-03-01 NOTE — Patient Instructions (Signed)
  1. Vaginal irritation Wet prep negative.  Will observe.  Yogourt/Probiotic to try.  Call back if worsening symptoms. - WET PREP FOR [place], YEAST, CLUE - Gono-Chlam pending  2. Pelvic pain in female Normal gynecologic exam. U/A Negative. - Urinalysis,Complete w/RFL Culture  Sharyn Lull, it was a pleasure seeing you today!

## 2019-03-03 ENCOUNTER — Encounter: Payer: Self-pay | Admitting: Family Medicine

## 2019-03-05 ENCOUNTER — Other Ambulatory Visit: Payer: Self-pay

## 2019-03-05 ENCOUNTER — Ambulatory Visit
Admission: RE | Admit: 2019-03-05 | Discharge: 2019-03-05 | Disposition: A | Discharge status: Home / Self Care | Payer: 59 | Source: Ambulatory Visit | Attending: Obstetrics & Gynecology | Admitting: Obstetrics & Gynecology

## 2019-03-05 DIAGNOSIS — Z1231 Encounter for screening mammogram for malignant neoplasm of breast: Secondary | ICD-10-CM

## 2019-03-11 ENCOUNTER — Other Ambulatory Visit: Payer: Self-pay

## 2019-03-12 ENCOUNTER — Ambulatory Visit (INDEPENDENT_AMBULATORY_CARE_PROVIDER_SITE_OTHER): Payer: 59 | Admitting: Obstetrics & Gynecology

## 2019-03-12 ENCOUNTER — Encounter: Payer: Self-pay | Admitting: Obstetrics & Gynecology

## 2019-03-12 VITALS — BP 132/78 | Ht 72.0 in | Wt 179.0 lb

## 2019-03-12 DIAGNOSIS — Z01419 Encounter for gynecological examination (general) (routine) without abnormal findings: Secondary | ICD-10-CM | POA: Diagnosis not present

## 2019-03-12 DIAGNOSIS — Z3041 Encounter for surveillance of contraceptive pills: Secondary | ICD-10-CM | POA: Diagnosis not present

## 2019-03-12 DIAGNOSIS — I10 Essential (primary) hypertension: Secondary | ICD-10-CM

## 2019-03-12 MED ORDER — NORETHINDRONE 0.35 MG PO TABS: 1.0000 | ORAL_TABLET | Freq: Every day | ORAL | 4 refills | Status: DC

## 2019-03-12 NOTE — Addendum Note (Signed)
Addended by: Thurnell Garbe A on: 03/12/2019 08:44 AM   Modules accepted: Orders

## 2019-03-12 NOTE — Progress Notes (Signed)
[name] 20-Sep-1965 [id number]   History:    54 y.o. G1P1L1 Married.  Son is 26 yo Chief Executive Officer in [employer] at [place] in [place]  RP:  Established patient presenting for annual gyn exam   HPI: Well on the Progestin-Pill.  Menses present.  Easy BTB if delays a pill.  No pelvic pain.  No pain with intercourse.  Breast normal.  cHTN on Norvasc 2.5 mg daily, well controled.  Body mass index 24.28. Walking daily.  Health labs with family physician.   Past medical history,surgical history, family history and social history were all reviewed and documented in the EPIC chart.  Gynecologic History Patient's last menstrual period was 03/02/2019.  Obstetric History OB History  Gravida Para Term Preterm AB Living  1 1       1   SAB TAB Ectopic Multiple Live Births               # Outcome Date GA Lbr Len/2nd Weight Sex Delivery Anes PTL Lv  1 Para              ROS: A ROS was performed and pertinent positives and negatives are included in the history.  GENERAL: No fevers or chills. HEENT: No change in vision, no earache, sore throat or sinus congestion. NECK: No pain or stiffness. CARDIOVASCULAR: No chest pain or pressure. No palpitations. PULMONARY: No shortness of breath, cough or wheeze. GASTROINTESTINAL: No abdominal pain, nausea, vomiting or diarrhea, melena or bright red blood per rectum. GENITOURINARY: No urinary frequency, urgency, hesitancy or dysuria. MUSCULOSKELETAL: No joint or muscle pain, no back pain, no recent trauma. DERMATOLOGIC: No rash, no itching, no lesions. ENDOCRINE: No polyuria, polydipsia, no heat or cold intolerance. No recent change in weight. HEMATOLOGICAL: No anemia or easy bruising or bleeding. NEUROLOGIC: No headache, seizures, numbness, tingling or weakness. PSYCHIATRIC: No depression, no loss of interest in normal activity or change in sleep pattern.     Exam:   BP 132/78   Ht 6' (1.829 m)   Wt 179 lb (81.2 kg)   LMP  03/02/2019 Comment: pill  BMI 24.28 kg/m   Body mass index is 24.28 kg/m.  General appearance : Well developed well nourished female. No acute distress HEENT: Eyes: no retinal hemorrhage or exudates,  Neck supple, trachea midline, no carotid bruits, no thyroidmegaly Lungs: Clear to auscultation, no rhonchi or wheezes, or rib retractions  Heart: Regular rate and rhythm, no murmurs or gallops Breast:Examined in sitting and supine position were symmetrical in appearance, no palpable masses or tenderness,  no skin retraction, no nipple inversion, no nipple discharge, no skin discoloration, no axillary or supraclavicular lymphadenopathy Abdomen: no palpable masses or tenderness, no rebound or guarding Extremities: no edema or skin discoloration or tenderness  Pelvic: Vulva: Normal             Vagina: No gross lesions or discharge  Cervix: No gross lesions or discharge.  Pap reflex done.  Uterus  AV, stable size about 9 cm, normal shape and hard consistency, non-tender and mobile  Adnexa  Without masses or tenderness  Anus: Normal   Assessment/Plan:  54 y.o. female for annual exam   1. Encounter for routine gynecological examination with Papanicolaou smear of cervix Normal stable gynecologic exam.  Pap reflex done.  Breast exam normal.  Screening mammogram March 2021 was negative.  Health labs with family physician and management of chronic hypertension.  Good body mass index at 24.28.  Continue with  fitness and healthy nutrition.  2. Encounter for surveillance of contraceptive pills Well on the progestin only birth control pill.  Still having menses.  No contraindication to continue.  Prescription sent to pharmacy.  3. Essential hypertension Blood pressure well controlled on Norvasc.  Continue with a low-salt nutrition.    Other orders - norethindrone (MICRONOR) 0.35 MG tablet; Take 1 tablet (0.35 mg total) by mouth daily.  [name] MD, 8:02 AM 03/12/2019

## 2019-03-12 NOTE — Patient Instructions (Signed)
1. Encounter for routine gynecological examination with Papanicolaou smear of cervix Normal stable gynecologic exam.  Pap reflex done.  Breast exam normal.  Screening mammogram March 2021 was negative.  Health labs with family physician and management of chronic hypertension.  Good body mass index at 24.28.  Continue with fitness and healthy nutrition.  2. Encounter for surveillance of contraceptive pills Well on the progestin only birth control pill.  Still having menses.  No contraindication to continue.  Prescription sent to pharmacy.  3. Essential hypertension Blood pressure well controlled on Norvasc.  Continue with a low-salt nutrition.    Other orders - norethindrone (MICRONOR) 0.35 MG tablet; Take 1 tablet (0.35 mg total) by mouth daily.  Selinda Eon, it was a pleasure seeing you today!  I will inform you of your results as soon as they are available.

## 2019-03-14 LAB — PAP IG W/ RFLX HPV ASCU

## 2019-03-29 ENCOUNTER — Other Ambulatory Visit: Payer: Self-pay | Admitting: Family Medicine

## 2019-03-29 DIAGNOSIS — I10 Essential (primary) hypertension: Secondary | ICD-10-CM

## 2019-04-06 NOTE — Patient Instructions (Addendum)
  HEALTH MAINTENANCE RECOMMENDATIONS:  It is recommended that you get at least 30 minutes of aerobic exercise at least 5 days/week (for weight loss, you may need as much as 60-90 minutes). This can be any activity that gets your heart rate up. This can be divided in 10-15 minute intervals if needed, but try and build up your endurance at least once a week.  Weight bearing exercise is also recommended twice weekly.  Eat a healthy diet with lots of vegetables, fruits and fiber.  "Colorful" foods have a lot of vitamins (ie green vegetables, tomatoes, red peppers, etc).  Limit sweet tea, regular sodas and alcoholic beverages, all of which has a lot of calories and sugar.  Up to 1 alcoholic drink daily may be beneficial for women (unless trying to lose weight, watch sugars).  Drink a lot of water.  Calcium recommendations are 1200-1500 mg daily (1500 mg for postmenopausal women or women without ovaries), and vitamin D 1000 IU daily.  This should be obtained from diet and/or supplements (vitamins), and calcium should not be taken all at once, but in divided doses.  Monthly self breast exams and yearly mammograms for women over the age of 69 is recommended.  Sunscreen of at least SPF 30 should be used on all sun-exposed parts of the skin when outside between the hours of 10 am and 4 pm (not just when at beach or pool, but even with exercise, golf, tennis, and yard work!)  Use a sunscreen that says "broad spectrum" so it covers both UVA and UVB rays, and make sure to reapply every 1-2 hours.  Remember to change the batteries in your smoke detectors when changing your clock times in the spring and fall. Carbon monoxide detectors are recommended for your home.  Use your seat belt every time you are in a car, and please drive safely and not be distracted with cell phones and texting while driving.  Colonoscopy or Cologuard is recommended for colon cancer screening.  Please let us know which referral you would  prefer.  I recommend getting the new shingles vaccine (Shingrix). You will need to check with your insurance to see if it is covered, and if covered, schedule a nurse visit at our office when convenient.  It is a series of 2 injections, spaced 2 months apart. It should be started 4 weeks after any other vaccine (ie COVID).

## 2019-04-06 NOTE — Progress Notes (Signed)
Chief Complaint  Patient presents with  . Annual Exam    fasting annual exam, no pap-sees Dr Dellis Filbert. Has a spot on left side of her neck that she thinks is contact dermatitis. (appeared same area 3 times over the last 1.5 years). Would like to know whether or not she should have an A1C done due to family hx, having a large baby and derm said something about skin tags.   . Other    did not check on shingles coverage. And she did not call back re:her colonoscopy, wanted to ask if you were ok with her having Cologuard.    [name] is a 54 y.o. female who presents for a complete physical.  She recently saw Dr. Dellis Filbert for GYN visit. She continues on progesterone-only pill. She has noted a lot of spotting (sometimes light, other times like a cycle), definitely worse if she takes the pill just a little later in the evening.  She has mentioned this to her GYN. She has the following concerns:  Rash L neck, improving (has had x 3 weeks)--started out as a large spot, and 2 smaller spots behind it.  Pictures on her phone showed that it was red, raised, and had one small pustule.  It was a little itchy.  She was concerned because she had been volunteering in the hospital, and son had also developed a rash, was worried it was contagious.  It has since improved, just using OTC hydrocortisone cream. She has had a similar rash occur 3x in same place.  Starts out as individual bumps, then can coalesce.  Once, she went out for a long walk, was very hungry, but showered before eating.  While fixing her food, she felt like she would pass out, felt like her BP dropped, needed to sit down. Putting head between her legs and eating both helped. This has only occurred one time.  Concern regarding screening for diabetes, as per HPI. Chart reviewed--had fasting glucose 78 in 06/2018. Her weight is down 5# since that visit (gained and lost). No polydipsia, polyuria.  Hypertension:  She is compliant with taking amlodipine  2.5mg  daily. She hasn't checked her BP recently at all. Monitor was verified as accurate in 12/2018. Got dizzy just the one time mentioned above.  She was seen in December with some vague complaints of chest pain (moved around chest, not exertional). She had a discomfort on the left just recently, the first time in a while, coincided with her cycle, started at left medial breast. She continues to walk regularly, and denies chest pain, palpitations, DOE. BP Readings from Last 3 Encounters:  04/07/19 130/86  03/12/19 132/78  02/26/19 120/78   She has previously complained of fatigue. Sleep study was done 05/2017 (due to snoring and unrefreshed seep). Study showed mild OSA (AHI 7.9). We had discussed treatment options, and recommended talking to her dentist about oral appliance. Dr. Toy Cookey wasn't on her insurance. Finally found someone to see to pursue the oral appliance, but then offices shut down due to COVID-19. She talked about it with her dentist.  She decided to see ENT to see if there was anything else.  She saw Dr. Benjamine Mola, and was told she has deviated septum and enlarged turbinates, 95% blockage of nasal passage. He recommended septoplasty and turbinate reduction.  She hasn't pursued anything, but now that she is vaccinated for COVID she will look into it again. She continues to have difficulty with breathing through her nose. Only occasionally has  spells of unrefreshed sleep, usually is fine, and no daytime somnolence.  History of kidney stone:  She tries to stay well hydrated and hasn't had any further problems. It was a Calcium Oxalate stone. She stopped using Tums for heartburn, uses pepcid prn instead.  H/o Vitamin D deficiency:  Had a level of 15 in 02/2014; had gone up to 27 when taking 1000 IU daily.Last level was 33.1 in 06/2018, when taking 2000 IU daily.  She continues to take 2000 IU daily.  Ho reflux-- Improved with dietary changes (cutting back on pizza, vinegar). She is back to  eating fresh tomatoes, and they no longer seem to bother her.  She uses Pepcid prn, once or twice a week, related to spicy foods. She no longer uses Tums (since kidney stone). Denies dysphagia.  Immunization History  Administered Date(s) Administered  . Influenza Split 09/02/2012  . Influenza,inj,Quad PF,6+ Mos 10/02/2013, 09/09/2015, 09/21/2016, 09/26/2017, 08/15/2018  . Influenza-Unspecified 11/03/2014  . PFIZER SARS-COV-2 Vaccination 03/12/2019, 04/02/2019  . Td 03/03/2015   Last Pap smear: 03/2019 Last mammogram: 03/2019 Last colonoscopy: never (referral put in 06/2018, pt didn't c/b to schedule) Last DEXA: never Dentist: every 6 months, past due d/t COVID, but scheduled Ophtho: scheduled (yearly) Exercise: walking 4x/week, 3-5 miles. Hasn't been doing weight-bearing exercise. She eats yogurt daily; she stopped drinking milk  Lipids: Lab Results  Component Value Date   CHOL 173 06/17/2018   HDL 51 06/17/2018   LDLCALC 108 (H) 06/17/2018   TRIG 68 06/17/2018   CHOLHDL 3.4 06/17/2018    PMH, PSH, SH and FH were reviewed and updated   Outpatient Encounter Medications as of 04/07/2019  Medication Sig  . amLODipine (NORVASC) 2.5 MG tablet TAKE 1 TABLET BY MOUTH EVERY DAY  . cholecalciferol (VITAMIN D) 1000 units tablet Take 2,000 Units daily by mouth.   . famotidine (PEPCID) 20 MG tablet Take 20 mg by mouth as needed for heartburn or indigestion.  . fexofenadine (ALLEGRA) 180 MG tablet Take 180 mg by mouth daily. Reported on 06/22/2015  . norethindrone (MICRONOR) 0.35 MG tablet Take 1 tablet (0.35 mg total) by mouth daily.  Marland Kitchen tretinoin (RETIN-A) [id number] % cream APPLICATIONS APPLY A PEA SIZED AMOUNT TO FACE AT BEDTIME  . acetaminophen (TYLENOL) 325 MG tablet Take 650 mg by mouth as needed for pain. Reported on 06/22/2015  . fluocinonide cream (LIDEX) [id number] % Apply 1 application topically 2 (two) times daily.   No facility-administered encounter medications on file as of 04/07/2019.    No Known Allergies  ROS: The patient denies anorexia, fever, vision changes, decreased hearing, ear pain, sore throat, breast concerns, palpitations, dizziness, syncope, dyspnea on exertion, cough, swelling, nausea, vomiting, diarrhea, constipation, abdominal pain, melena, hematochezia, hematuria, incontinence, dysuria, vaginal discharge, odor or itch, genital lesions, numbness, tingling, weakness, tremor, suspicious skin lesions, depression, anxiety, abnormal bleeding/bruising, or enlarged lymph nodes.  Occasional bilateral knee pain, when walking up steep hills (not so much with stairs), and sitting with legs underneath her for a while. Raynaud's --mainly in winter, less frequent/mild. Chronic nasal congestion/allergies. No longer getting headaches (rare).   Occasional heartburn, with certain foods only, relieved by Pepcid. Rare stress incontinence with sneeze. 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  She also had a BCC on shoulder last year. Rash L neck per HPI Rash on hand--dry skin per derm, comes/goes. Noticed a small bump inside L  ear, worried about cancer. Not painful   PHYSICAL EXAM:  BP 130/86   Pulse 80   Temp 97.8 F (36.6 C) (Other (Comment))   Ht 6' (1.829 m)   Wt 169 lb 14.4 oz (77.1 kg)   BMI 23.04 kg/m    Wt Readings from Last 3 Encounters:  04/07/19 169 lb 14.4 oz (77.1 kg)  03/12/19 179 lb (81.2 kg)  12/04/18 180 lb 6.4 oz (81.8 kg)    General Appearance:  Alert, cooperative, no distress, appears stated age   Head:  Normocephalic, without obvious abnormality, atraumatic   Eyes:  PERRL, conjunctiva/corneas clear, EOM's intact, fundi benign   Ears:  Very small raised area L posterior canal, slightly pink, possible very small/early cyst.  Cerumen partially obstructing views of TM's bilaterally    Nose:  Not examined, wearing mask due to COVID-19 pandemic  Throat:  Not examined, wearing mask due to COVID-19 pandemic  Neck:  Supple, no lymphadenopathy; thyroid: no enlargement/tenderness/nodules; no carotid bruit or JVD   Back:  Spine nontender, no curvature, ROM normal, no CVA tenderness   Lungs:  Clear to auscultation bilaterally without wheezes, rales or ronchi; respirations unlabored   Chest Wall:  No tenderness or deformity   Heart:  Regular rate and rhythm, S1 and S2 normal, no murmur, rub or gallop   Breast Exam:  Deferred to GYN   Abdomen:  Soft, non-tender, nondistended, normoactive bowel sounds, no masses, no hepatosplenomegaly   Genitalia:  Deferred to GYN      Extremities:  No clubbing, cyanosis or edema   Pulses:  2+ and symmetric all extremities   Skin:  Skin color, texture, turgor normal, no lesions. Healing scars R posterior shoulder. Hyperpigmented macule at left neck (one large one, with 2 smaller area posterior to it). Small dry patch on the dorsum of right hand  Lymph nodes:  Cervical, supraclavicular, and axillary nodes normal   Neurologic:  Normal strength, sensation and gait; reflexes 2+ and symmetric throughout. Alert and oriented                  Psych: Normal mood, affect, hygiene and grooming    ASSESSMENT/PLAN:  Annual physical exam - Plan: Comprehensive metabolic panel, CBC with Differential/Platelet, TSH  OSA (obstructive sleep apnea) - mild on testing, only intermittently has symptoms  Colon cancer screening - counseled re: colonoscopy and Cologuard. She will think about and let us know which referral to put through  Vitamin D deficiency - continue 2000 IU daily  Essential hypertension - well controlled, cont amlodipine 2.5mg  (likely also helping her Raynaud's) - Plan: amLODipine (NORVASC) 2.5 MG tablet, Comprehensive metabolic panel  Hair loss - noticed last year, significantly  thinned.  Thinks it is growing back in - Plan: TSH  Irregular menses - suspect related to mini-pill. To f/u with GYN to discuss options if worsening, affecting QOL - Plan: CBC with Differential/Platelet, TSH  Rash - on neck--only hyperpigmentation left as it is healing. No active issues to warrant treatment  Eczema, unspecified type - on hand. counseled re: moisturizing, staying hydrated, HC prn  Deviated septum - and chronic nasal congestion and allergies. She is considering surgery, and plans to f/u with ENT to discuss    Discussed monthly self breast exams and yearly mammograms; at least 30 minutes of aerobic activity at least 5 days/week, weight-bearing exercise at least 2x/wk; proper sunscreen use reviewed; healthy diet, including goals of calcium and vitamin D intake and alcohol recommendations (less than or equal  to 1 drink/day) reviewed; regular seatbelt use; changing batteries in smoke detectors. Immunization recommendations discussed-- Continue yearly flu shots.Shingrix recommended--to check insurance. Discussed SE and timing re: COVID vaccine.  Colon cancer screening is due.  She is deciding between Cologuard and colonoscopy and will let us know.  F/u 1 year, sooner prn.

## 2019-04-07 ENCOUNTER — Encounter: Payer: Self-pay | Admitting: Family Medicine

## 2019-04-07 ENCOUNTER — Ambulatory Visit (INDEPENDENT_AMBULATORY_CARE_PROVIDER_SITE_OTHER): Payer: 59 | Admitting: Family Medicine

## 2019-04-07 ENCOUNTER — Other Ambulatory Visit: Payer: Self-pay

## 2019-04-07 VITALS — BP 130/86 | HR 80 | Temp 97.8°F | Ht 72.0 in | Wt 169.9 lb

## 2019-04-07 DIAGNOSIS — L309 Dermatitis, unspecified: Secondary | ICD-10-CM

## 2019-04-07 DIAGNOSIS — G4733 Obstructive sleep apnea (adult) (pediatric): Secondary | ICD-10-CM

## 2019-04-07 DIAGNOSIS — E559 Vitamin D deficiency, unspecified: Secondary | ICD-10-CM

## 2019-04-07 DIAGNOSIS — Z0000 Encounter for general adult medical examination without abnormal findings: Secondary | ICD-10-CM | POA: Diagnosis not present

## 2019-04-07 DIAGNOSIS — L659 Nonscarring hair loss, unspecified: Secondary | ICD-10-CM

## 2019-04-07 DIAGNOSIS — Z1211 Encounter for screening for malignant neoplasm of colon: Secondary | ICD-10-CM

## 2019-04-07 DIAGNOSIS — I10 Essential (primary) hypertension: Secondary | ICD-10-CM

## 2019-04-07 DIAGNOSIS — J342 Deviated nasal septum: Secondary | ICD-10-CM

## 2019-04-07 DIAGNOSIS — R21 Rash and other nonspecific skin eruption: Secondary | ICD-10-CM

## 2019-04-07 DIAGNOSIS — N926 Irregular menstruation, unspecified: Secondary | ICD-10-CM

## 2019-04-07 LAB — POCT URINALYSIS DIP (PROADVANTAGE DEVICE)
Bilirubin, UA: NEGATIVE
Glucose, UA: NEGATIVE mg/dL
Leukocytes, UA: NEGATIVE
Nitrite, UA: NEGATIVE
Protein Ur, POC: NEGATIVE mg/dL
Specific Gravity, Urine: 1.015
Urobilinogen, Ur: NEGATIVE
pH, UA: 6.5 (ref 5.0–8.0)

## 2019-04-07 MED ORDER — AMLODIPINE BESYLATE 2.5 MG PO TABS: 2.5000 mg | ORAL_TABLET | Freq: Every day | ORAL | 3 refills | Status: DC

## 2019-04-07 NOTE — Addendum Note (Signed)
Addended by: Carolee Rota F on: 04/07/2019 10:13 AM   Modules accepted: Orders

## 2019-04-08 LAB — COMPREHENSIVE METABOLIC PANEL
ALT: 29 IU/L (ref 0–32)
AST: 34 IU/L (ref 0–40)
Albumin/Globulin Ratio: 2.1 (ref 1.2–2.2)
Albumin: 4.6 g/dL (ref 3.8–4.9)
Alkaline Phosphatase: 81 IU/L (ref 39–117)
BUN/Creatinine Ratio: 10 (ref 9–23)
BUN: 7 mg/dL (ref 6–24)
Bilirubin Total: 0.6 mg/dL (ref 0.0–1.2)
CO2: 24 mmol/L (ref 20–29)
Calcium: 8.4 mg/dL — ABNORMAL LOW (ref 8.7–10.2)
Chloride: 104 mmol/L (ref 96–106)
Creatinine, Ser: 0.69 mg/dL (ref 0.57–1.00)
GFR calc Af Amer: 117 mL/min/{1.73_m2} (ref >59)
GFR calc non Af Amer: 101 mL/min/{1.73_m2} (ref >59)
Globulin, Total: 2.2 g/dL (ref 1.5–4.5)
Glucose: 82 mg/dL (ref 65–99)
Potassium: 4.3 mmol/L (ref 3.5–5.2)
Sodium: 141 mmol/L (ref 134–144)
Total Protein: 6.8 g/dL (ref 6.0–8.5)

## 2019-04-08 LAB — CBC WITH DIFFERENTIAL/PLATELET
Basophils Absolute: 0.1 10*3/uL (ref 0.0–0.2)
Basos: 1 %
EOS (ABSOLUTE): 0.2 10*3/uL (ref 0.0–0.4)
Eos: 3 %
Hematocrit: 43 % (ref 34.0–46.6)
Hemoglobin: 14.5 g/dL (ref 11.1–15.9)
Immature Grans (Abs): 0 10*3/uL (ref 0.0–0.1)
Immature Granulocytes: 0 %
Lymphocytes Absolute: 1.4 10*3/uL (ref 0.7–3.1)
Lymphs: 21 %
MCH: 29.4 pg (ref 26.6–33.0)
MCHC: 33.7 g/dL (ref 31.5–35.7)
MCV: 87 fL (ref 79–97)
Monocytes Absolute: 0.5 10*3/uL (ref 0.1–0.9)
Monocytes: 7 %
Neutrophils Absolute: 4.5 10*3/uL (ref 1.4–7.0)
Neutrophils: 68 %
Platelets: 272 10*3/uL (ref 150–450)
RBC: 4.93 x10E6/uL (ref 3.77–5.28)
RDW: 11.6 % — ABNORMAL LOW (ref 11.7–15.4)
WBC: 6.6 10*3/uL (ref 3.4–10.8)

## 2019-04-08 LAB — TSH: TSH: 2.17 u[IU]/mL (ref 0.450–4.500)

## 2019-06-18 ENCOUNTER — Encounter: Payer: Self-pay | Admitting: Family Medicine

## 2020-01-27 ENCOUNTER — Other Ambulatory Visit: Payer: Self-pay | Admitting: Obstetrics & Gynecology

## 2020-01-27 DIAGNOSIS — Z0000 Encounter for general adult medical examination without abnormal findings: Secondary | ICD-10-CM

## 2020-03-15 DIAGNOSIS — Z1231 Encounter for screening mammogram for malignant neoplasm of breast: Secondary | ICD-10-CM

## 2020-03-22 ENCOUNTER — Other Ambulatory Visit: Payer: Self-pay

## 2020-03-22 ENCOUNTER — Ambulatory Visit
Admission: RE | Admit: 2020-03-22 | Discharge: 2020-03-22 | Disposition: A | Discharge status: Home / Self Care | Payer: BC Managed Care – PPO | Source: Ambulatory Visit | Attending: Obstetrics & Gynecology | Admitting: Obstetrics & Gynecology

## 2020-03-22 ENCOUNTER — Ambulatory Visit: Payer: 59 | Admitting: Obstetrics & Gynecology

## 2020-03-22 DIAGNOSIS — Z1231 Encounter for screening mammogram for malignant neoplasm of breast: Secondary | ICD-10-CM | POA: Diagnosis not present

## 2020-03-22 DIAGNOSIS — Z0000 Encounter for general adult medical examination without abnormal findings: Secondary | ICD-10-CM

## 2020-03-25 ENCOUNTER — Telehealth: Payer: Self-pay | Admitting: Family Medicine

## 2020-03-25 NOTE — Telephone Encounter (Signed)
Advised pt of same. 

## 2020-03-25 NOTE — Telephone Encounter (Signed)
Pt is wanting to schedule a colonoscopy and wants to know who you prefer she uses.  She will call and make the appointment per pt. [phone]

## 2020-03-25 NOTE — Telephone Encounter (Signed)
Advise pt--I don't generally have a preference (if pt prefers a particular practice based on family member, etc then I will send to same one). [facility] is where I send if they don't have a preference, as they are in the system and we can easily get the results of her tests.

## 2020-03-26 ENCOUNTER — Encounter: Payer: Self-pay | Admitting: Gastroenterology

## 2020-04-01 DIAGNOSIS — Z85828 Personal history of other malignant neoplasm of skin: Secondary | ICD-10-CM | POA: Diagnosis not present

## 2020-04-01 DIAGNOSIS — D2271 Melanocytic nevi of right lower limb, including hip: Secondary | ICD-10-CM | POA: Diagnosis not present

## 2020-04-01 DIAGNOSIS — C44311 Basal cell carcinoma of skin of nose: Secondary | ICD-10-CM | POA: Diagnosis not present

## 2020-04-01 DIAGNOSIS — D2272 Melanocytic nevi of left lower limb, including hip: Secondary | ICD-10-CM | POA: Diagnosis not present

## 2020-04-01 DIAGNOSIS — L821 Other seborrheic keratosis: Secondary | ICD-10-CM | POA: Diagnosis not present

## 2020-04-06 NOTE — Patient Instructions (Addendum)
HEALTH MAINTENANCE RECOMMENDATIONS:  It is recommended that you get at least 30 minutes of aerobic exercise at least 5 days/week (for weight loss, you may need as much as 60-90 minutes). This can be any activity that gets your heart rate up. This can be divided in 10-15 minute intervals if needed, but try and build up your endurance at least once a week.  Weight bearing exercise is also recommended twice weekly.  Eat a healthy diet with lots of vegetables, fruits and fiber.  "Colorful" foods have a lot of vitamins (ie green vegetables, tomatoes, red peppers, etc).  Limit sweet tea, regular sodas and alcoholic beverages, all of which has a lot of calories and sugar.  Up to 1 alcoholic drink daily may be beneficial for women (unless trying to lose weight, watch sugars).  Drink a lot of water.  Calcium recommendations are 1200-1500 mg daily (1500 mg for postmenopausal women or women without ovaries), and vitamin D 1000 IU daily.  This should be obtained from diet and/or supplements (vitamins), and calcium should not be taken all at once, but in divided doses.  Monthly self breast exams and yearly mammograms for women over the age of 50 is recommended.  Sunscreen of at least SPF 30 should be used on all sun-exposed parts of the skin when outside between the hours of 10 am and 4 pm (not just when at beach or pool, but even with exercise, golf, tennis, and yard work!)  Use a sunscreen that says "broad spectrum" so it covers both UVA and UVB rays, and make sure to reapply every 1-2 hours.  Remember to change the batteries in your smoke detectors when changing your clock times in the spring and fall. Carbon monoxide detectors are recommended for your home.  Use your seat belt every time you are in a car, and please drive safely and not be distracted with cell phones and texting while driving.  I recommend getting the new shingles vaccine (Shingrix). Check with your insurance to verify what your out of  pocket cost may be (usually covered as preventative, but better to verify to avoid any surprises, as this vaccine is expensive), and then schedule a nurse visit at our office when convenient (based on the possible side effects as discussed).   This is a series of 2 injections, spaced 2 months apart.  It doesn't have to be exactly 2 months apart (but can't be under 2 months), if that isn't feasible for your schedule, but try and get them close to 2 months (and definitely within 6 months of each other, or else the efficacy of the vaccine drops off).  This should be separated from any other vaccines by at least 2 weeks.  Please try and check your blood pressure once or twice a month (more often if running higher). Goal is averaging less than 130/80 (ideally under that goal all of the time). If 135/85 or greater consistently, please return to discuss.    Consider doubling up on your vitamin D from October through March (winter months).  We discussed considering getting Hepatitis B vaccination, since you haven't had that. (optional).  The newer vaccine is more effective and fewer shots, called HepliSav B.   Calcium Content in Foods Calcium is the most abundant mineral in the body. Most of the body's calcium supply is stored in bones and teeth. Calcium helps many parts of the body function normally, including:  Blood and blood vessels.  Nerves.  Hormones.  Muscles.  Bones  and teeth. When your calcium stores are low, you may be at risk for low bone mass, bone loss, and broken bones (fractures). When you get enough calcium, it helps to support strong bones and teeth throughout your life. Calcium is especially important for:  Children during growth spurts.  Girls during adolescence.  Women who are pregnant or breastfeeding.  Women after their menstrual cycle stops (postmenopause).  Women whose menstrual cycle has stopped due to anorexia nervosa or regular intense exercise.  People who  cannot eat or digest dairy products.  Vegans. Recommended daily amounts of calcium:  Women (ages 62 to 7): 1,000 mg per day.  Women (ages 55 and older): 1,200 mg per day.  Men (ages 4 to 79): 1,000 mg per day.  Men (ages 26 and older): 1,200 mg per day.  Women (ages 25 to 76): 1,300 mg per day.  Men (ages 42 to 56): 1,300 mg per day. General information  Eat foods that are high in calcium. Try to get most of your calcium from food.  Some people may benefit from taking calcium supplements. Check with your health care provider or diet and nutrition specialist (dietitian) before starting any calcium supplements. Calcium supplements may interact with certain medicines. Too much calcium may cause other health problems, such as constipation and kidney stones.  For the body to absorb calcium, it needs vitamin D. Sources of vitamin D include: ? Skin exposure to direct sunlight. ? Foods, such as egg yolks, liver, mushrooms, saltwater fish, and fortified milk. ? Vitamin D supplements. Check with your health care provider or dietitian before starting any vitamin D supplements. What foods are high in calcium? Foods that are high in calcium contain more than 100 milligrams per serving. Fruits  Fortified orange juice or other fruit juice, 300 mg per 8 oz serving. Vegetables  Collard greens, 360 mg per 8 oz serving.  Kale, 100 mg per 8 oz serving.  Bok choy, 160 mg per 8 oz serving. Grains  Fortified ready-to-eat cereals, 100 to 1,000 mg per 8 oz serving.  Fortified frozen waffles, 200 mg in 2 waffles.  Oatmeal, 140 mg in 1 cup. Meats and other proteins  Sardines, canned with bones, 325 mg per 3 oz serving.  Salmon, canned with bones, 180 mg per 3 oz serving.  Canned shrimp, 125 mg per 3 oz serving.  Baked beans, 160 mg per 4 oz serving.  Tofu, firm, made with calcium sulfate, 253 mg per 4 oz serving. Dairy  Yogurt, plain, low-fat, 310 mg per 6 oz serving.  Nonfat milk,  300 mg per 8 oz serving.  American cheese, 195 mg per 1 oz serving.  Cheddar cheese, 205 mg per 1 oz serving.  Cottage cheese 2%, 105 mg per 4 oz serving.  Fortified soy, rice, or almond milk, 300 mg per 8 oz serving.  Mozzarella, part skim, 210 mg per 1 oz serving. The items listed above may not be a complete list of foods high in calcium. Actual amounts of calcium may be different depending on processing. Contact a dietitian for more information.   What foods are lower in calcium? Foods that are lower in calcium contain 50 mg or less per serving. Fruits  Apple, about 6 mg.  Banana, about 12 mg. Vegetables  Lettuce, 19 mg per 2 oz serving.  Tomato, about 11 mg. Grains  Rice, 4 mg per 6 oz serving.  Boiled potatoes, 14 mg per 8 oz serving.  White bread, 6 mg per slice.  Meats and other proteins  Egg, 27 mg per 2 oz serving.  Red meat, 7 mg per 4 oz serving.  Chicken, 17 mg per 4 oz serving.  Fish, cod, or trout, 20 mg per 4 oz serving. Dairy  Cream cheese, regular, 14 mg per 1 Tbsp serving.  Brie cheese, 50 mg per 1 oz serving.  Parmesan cheese, 70 mg per 1 Tbsp serving. The items listed above may not be a complete list of foods lower in calcium. Actual amounts of calcium may be different depending on processing. Contact a dietitian for more information. Summary  Calcium is an important mineral in the body because it affects many functions. Getting enough calcium helps support strong bones and teeth throughout your life.  Try to get most of your calcium from food.  Calcium supplements may interact with certain medicines. Check with your health care provider or dietitian before starting any calcium supplements. This information is not intended to replace advice given to you by your health care provider. Make sure you discuss any questions you have with your health care provider. Document Revised: 04/16/2019 Document Reviewed: 04/16/2019 Elsevier Patient  Education  [place].

## 2020-04-06 NOTE — Progress Notes (Signed)
Chief Complaint  Patient presents with  . Annual Exam    Fasting annual exam, no pap-sees Dr.Lavoie. Sees eye doctor annually for eye exam. Had covid late Dec/early Jan so did not get booster-would like to get today. Has colonoscopy scheduled with Dr. Havery Moros @ [place] 04/26/20. Thinks she has some pain down the back of her right leg.     [name] is a 55 y.o. female who presents for a complete physical.  She is scheduled for later this month to see Dr. Dellis Filbert for GYN visit.  She has the following concerns:   Pain down the back of R leg started during COVID, when sitting more.  Dining room chair (which she uses at her desk) pressed on the back of her thigh, caused some discomfort after seating. Occasionally also gets a discomfort at her lateral ankle.  At one point she had pain in the whole R leg. Only noticed after prolonged sitting in the chair. She is worried about it being sciatica, related to pyriformis muscle.  She did home exercise, and used tennis ball to massage. She has also changed the chair several times. The discomfort has improved significantly with these measures.   She occasional gets discomfort at the mid posterior thigh, sometimes at her lateral ankle. Not as often.  Tennis ball still helps with any tightness/discomfort in her buttock area.  Hypertension:  She is compliant with taking amlodipine 2.5mg  daily.  BP's aren't checked very frequently, recalls they were all fine. Monitor was verified as accurate in 12/2018.  She continues to walk regularly, and denies chest pain, shortness of breath, palpitations.  She has previously complained of fatigue. Sleep study was done 05/2017 (due to snoring and unrefreshed seep). Study showed mild OSA (AHI 7.9). We had discussed treatment options, and recommended talking to her dentist about oral appliance. Dr. Toy Cookey wasn't on her insurance. Finally found someone to see to pursue the oral appliance, but then offices shut down due  to COVID-19. She talked about it with her dentist.  She decided to see ENT to see if there was anything else.  She saw Dr. Benjamine Mola, and was told she has deviated septum and enlarged turbinates, 95% blockage of nasal passage. He recommended septoplasty and turbinate reduction.  She hadn't pursued anything yet.  She continues to have some difficulty with breathing through her nose, fluctuates with her allergies, not too bothersome to her. She drifts off to sleep easily during the day only with reading, or with watching TV, but that is often later at night.  This fluctuates, not daily. Not falling asleep driving or other issues. She usually feels refreshed when she wakes up.  History of kidney stone:  She tries to stay well hydrated and hasn't had any further problems. It was a Calcium Oxalate stone. She stopped using Tums for heartburn, uses pepcid prn instead.  H/o Vitamin D deficiency:  Had a level of 15 in 02/2014; had gone up to 27 when taking 1000 IU daily.Last level was 33.1 in 06/2018, when taking 2000 IU daily.  She continues to take 2000 IU daily.  Ho reflux-- Improved with dietary changes (cutting back on pizza, vinegar). She is back to eating fresh tomatoes, as they no longer seem to bother her (just tomato sauce).  She cut back on spicy foods, so using Pepcid less frequently.  She no longer uses Tums (since kidney stone). Denies dysphagia.  Immunization History  Administered Date(s) Administered  . Influenza Split 09/02/2012  . Influenza,inj,Quad  PF,6+ Mos 10/02/2013, 09/09/2015, 09/21/2016, 09/26/2017, 08/15/2018, 08/29/2019  . Influenza-Unspecified 11/03/2014  . PFIZER(Purple Top)SARS-COV-2 Vaccination 03/12/2019, 04/02/2019  . Td 03/03/2015   Last Pap smear: 03/2019 Last mammogram: 03/2020 Last colonoscopy: never, scheduled for later this month Last DEXA: never Dentist: not as regularly since [place], but has gone (just not as regularly) Ophtho: regularly Exercise: walking 1x/week for  60-90 mins (4-6 miles).  Walks a lot at work (not cardio), occ walks on lunch break. Hasn't been doing weight-bearing exercise. She eats yogurt daily; she drinks some milk.  Lipids: Lab Results  Component Value Date   CHOL 173 06/17/2018   HDL 51 06/17/2018   LDLCALC 108 (H) 06/17/2018   TRIG 68 06/17/2018   CHOLHDL 3.4 06/17/2018    PMH, PSH, SH and FH were reviewed and updated   Outpatient Encounter Medications as of 04/07/2020  Medication Sig  . cholecalciferol (VITAMIN D) 1000 units tablet Take 2,000 Units daily by mouth.   . fexofenadine (ALLEGRA) 180 MG tablet Take 180 mg by mouth daily. Reported on 06/22/2015  . norethindrone (MICRONOR) 0.35 MG tablet Take 1 tablet (0.35 mg total) by mouth daily.  . [DISCONTINUED] amLODipine (NORVASC) 2.5 MG tablet Take 1 tablet (2.5 mg total) by mouth daily.  Marland Kitchen acetaminophen (TYLENOL) 325 MG tablet Take 650 mg by mouth as needed for pain. Reported on 06/22/2015 (Patient not taking: Reported on 04/07/2020)  . amLODipine (NORVASC) 2.5 MG tablet Take 1 tablet (2.5 mg total) by mouth daily.  . famotidine (PEPCID) 20 MG tablet Take 20 mg by mouth as needed for heartburn or indigestion. (Patient not taking: Reported on 04/07/2020)  . tretinoin (RETIN-A) 7.322 % cream APPLICATIONS APPLY A PEA SIZED AMOUNT TO FACE AT BEDTIME (Patient not taking: Reported on 04/07/2020)  . [DISCONTINUED] fluocinonide cream (LIDEX) 0.25 % Apply 1 application topically 2 (two) times daily.   No facility-administered encounter medications on file as of 04/07/2020.   No Known Allergies  ROS: The patient denies anorexia, fever, vision changes, decreased hearing, ear pain, sore throat, breast concerns, palpitations, dizziness, syncope, dyspnea on exertion, cough, swelling, nausea, vomiting, diarrhea, constipation, abdominal pain, melena, hematochezia, hematuria, incontinence, dysuria, vaginal discharge, odor or itch, genital lesions, numbness, tingling, weakness, tremor, suspicious skin  lesions, depression, anxiety, abnormal bleeding/bruising, or enlarged lymph nodes.  Occasional bilateral knee pain, when walking up steep hills--less frequently (better if walks the hills slower) Raynaud's --mainly in winter, less frequent/mild. Chronic nasal congestion/allergies, unchanged. No longer getting headaches (rare).   Occasional heartburn, with certain foods only, relieved by Pepcid. Rare stress incontinence with sneeze. Sees dermatologist regularly, 2x/year, just had something removed from her nose (path pending). She continues on the progesterone-only pill--still having menses--more frequent, shorter.  No hot flashes or night sweats. Rash on R hand, comes and goes, related to dry skin, sees derm.   PHYSICAL EXAM:  BP 120/84   Pulse 72   Ht 6' (1.829 m)   Wt 174 lb (78.9 kg)   LMP 03/20/2020   BMI 23.60 kg/m   Wt Readings from Last 3 Encounters:  04/07/20 174 lb (78.9 kg)  04/07/19 169 lb 14.4 oz (77.1 kg)  03/12/19 179 lb (81.2 kg)    General Appearance:  Alert, cooperative, no distress, appears stated age   Head:  Normocephalic, without obvious abnormality, atraumatic   Eyes:  PERRL, conjunctiva/corneas clear, EOM's intact, fundi benign   Ears:  Cerumen partially obstructing views of TM's bilaterally. Visualized portions are normal  Nose:  Not examined, wearing mask  due to COVID-19 pandemic  Throat:  Not examined, wearing mask due to COVID-19 pandemic  Neck:  Supple, no lymphadenopathy; thyroid: no enlargement/tenderness/nodules; no carotid bruit or JVD   Back:  Spine nontender, no curvature, ROM normal, no CVA tenderness   Lungs:  Clear to auscultation bilaterally without wheezes, rales or ronchi; respirations unlabored   Chest Wall:  No tenderness or deformity   Heart:  Regular rate and rhythm, S1 and S2 normal, no murmur, rub or gallop   Breast Exam:  Deferred to GYN   Abdomen:  Soft, non-tender, nondistended,  normoactive bowel sounds, no masses, no hepatosplenomegaly   Genitalia:  Deferred to GYN      Extremities:  No clubbing, cyanosis or edema   Pulses:  2+ and symmetric all extremities   Skin:  Skin color, texture, turgor normal, no lesions. Cluster of papules on the back of her R hand  Lymph nodes:  Cervical, supraclavicular, and axillary nodes normal   Neurologic:  Normal strength, sensation and gait; reflexes 2+ and symmetric throughout. Alert and oriented                  Psych: Normal mood, affect, hygiene and grooming    ASSESSMENT/PLAN:  Annual physical exam - Plan: POCT Urinalysis DIP (Proadvantage Device), Lipid panel, Comprehensive metabolic panel, CBC with Differential/Platelet, HIV Antibody (routine testing w rflx), Hepatitis C antibody  Essential hypertension - well controlled, cont amlodipine.  She had questions about meds for HTN related to husband, explained med choice for her also related to her Raynaud's - Plan: Comprehensive metabolic panel, amLODipine (NORVASC) 2.5 MG tablet  Vitamin D deficiency - continue daily supplement  OSA (obstructive sleep apnea) - Mild, not treated.  We reviewed s/sx for which, if worsening/consistent, options for treatment should be re-addressed  Need for COVID-19 vaccine - Plan: PFIZER Comirnaty(GRAY TOP)COVID-19 Vaccine  Need for hepatitis C screening test - Plan: Hepatitis C antibody  Screening for HIV (human immunodeficiency virus) - Plan: HIV Antibody (routine testing w rflx)  Eczema, unspecified type - on hand, reviewed treatment measures   Nasal septal deviation - reviewed treatment for allergies, as this is when it bothers her most. to f/u with ENT if desires treatment  History of kidney stones - reviewed dietary sources of Ca and recommendations in order to prevent osteoporosis. To stay well hydrated and avoid excess Ca supplementation    c-met (low Ca last year), lipids, CBC, HepC/HIV Other labs  normal last year (CBC, TSH), normal lipids 06/2018  Discussed monthly self breast exams and yearly mammograms; at least 30 minutes of aerobic activity at least 5 days/week, weight-bearing exercise at least 2x/wk; proper sunscreen use reviewed; healthy diet, including goals of calcium and vitamin D intake and alcohol recommendations (less than or equal to 1 drink/day) reviewed; regular seatbelt use; changing batteries in smoke detectors. Immunization recommendations discussed-- Continue yearly flu shots. COVID booster given today. Shingrix recommended--to check insurance and schedule NV, to wait at least 2 weeks from today. Colon cancer screening is overdue, scheduled for later this month.   F/u 1 year, sooner prn.

## 2020-04-07 ENCOUNTER — Encounter: Payer: Self-pay | Admitting: Family Medicine

## 2020-04-07 ENCOUNTER — Other Ambulatory Visit: Payer: Self-pay

## 2020-04-07 ENCOUNTER — Ambulatory Visit (INDEPENDENT_AMBULATORY_CARE_PROVIDER_SITE_OTHER): Payer: BC Managed Care – PPO | Admitting: Family Medicine

## 2020-04-07 VITALS — BP 120/84 | HR 72 | Ht 72.0 in | Wt 174.0 lb

## 2020-04-07 DIAGNOSIS — J342 Deviated nasal septum: Secondary | ICD-10-CM

## 2020-04-07 DIAGNOSIS — Z1159 Encounter for screening for other viral diseases: Secondary | ICD-10-CM | POA: Diagnosis not present

## 2020-04-07 DIAGNOSIS — G4733 Obstructive sleep apnea (adult) (pediatric): Secondary | ICD-10-CM

## 2020-04-07 DIAGNOSIS — Z1211 Encounter for screening for malignant neoplasm of colon: Secondary | ICD-10-CM

## 2020-04-07 DIAGNOSIS — E559 Vitamin D deficiency, unspecified: Secondary | ICD-10-CM

## 2020-04-07 DIAGNOSIS — I10 Essential (primary) hypertension: Secondary | ICD-10-CM

## 2020-04-07 DIAGNOSIS — Z114 Encounter for screening for human immunodeficiency virus [HIV]: Secondary | ICD-10-CM

## 2020-04-07 DIAGNOSIS — Z0000 Encounter for general adult medical examination without abnormal findings: Secondary | ICD-10-CM | POA: Diagnosis not present

## 2020-04-07 DIAGNOSIS — Z23 Encounter for immunization: Secondary | ICD-10-CM

## 2020-04-07 DIAGNOSIS — L309 Dermatitis, unspecified: Secondary | ICD-10-CM

## 2020-04-07 DIAGNOSIS — Z87442 Personal history of urinary calculi: Secondary | ICD-10-CM

## 2020-04-07 LAB — POCT URINALYSIS DIP (PROADVANTAGE DEVICE)
Bilirubin, UA: NEGATIVE
Blood, UA: NEGATIVE
Glucose, UA: NEGATIVE mg/dL
Ketones, POC UA: NEGATIVE mg/dL
Leukocytes, UA: NEGATIVE
Nitrite, UA: NEGATIVE
Protein Ur, POC: NEGATIVE mg/dL
Specific Gravity, Urine: 1.01
Urobilinogen, Ur: NEGATIVE
pH, UA: 6.5 (ref 5.0–8.0)

## 2020-04-07 MED ORDER — AMLODIPINE BESYLATE 2.5 MG PO TABS: 2.5000 mg | ORAL_TABLET | Freq: Every day | ORAL | 3 refills | Status: DC

## 2020-04-08 LAB — COMPREHENSIVE METABOLIC PANEL
ALT: 17 IU/L (ref 0–32)
AST: 20 IU/L (ref 0–40)
Albumin/Globulin Ratio: 2.8 — ABNORMAL HIGH (ref 1.2–2.2)
Albumin: 4.7 g/dL (ref 3.8–4.9)
Alkaline Phosphatase: 75 IU/L (ref 44–121)
BUN/Creatinine Ratio: 8 — ABNORMAL LOW (ref 9–23)
BUN: 6 mg/dL (ref 6–24)
Bilirubin Total: 0.6 mg/dL (ref 0.0–1.2)
CO2: 21 mmol/L (ref 20–29)
Calcium: 9.1 mg/dL (ref 8.7–10.2)
Chloride: 101 mmol/L (ref 96–106)
Creatinine, Ser: 0.75 mg/dL (ref 0.57–1.00)
Globulin, Total: 1.7 g/dL (ref 1.5–4.5)
Glucose: 86 mg/dL (ref 65–99)
Potassium: 4.3 mmol/L (ref 3.5–5.2)
Sodium: 135 mmol/L (ref 134–144)
Total Protein: 6.4 g/dL (ref 6.0–8.5)
eGFR: 96 mL/min/{1.73_m2} (ref >59)

## 2020-04-08 LAB — CBC WITH DIFFERENTIAL/PLATELET
Basophils Absolute: 0 10*3/uL (ref 0.0–0.2)
Basos: 1 %
EOS (ABSOLUTE): 0.1 10*3/uL (ref 0.0–0.4)
Eos: 2 %
Hematocrit: 41.6 % (ref 34.0–46.6)
Hemoglobin: 14.1 g/dL (ref 11.1–15.9)
Immature Grans (Abs): 0 10*3/uL (ref 0.0–0.1)
Immature Granulocytes: 0 %
Lymphocytes Absolute: 1.3 10*3/uL (ref 0.7–3.1)
Lymphs: 20 %
MCH: 30.3 pg (ref 26.6–33.0)
MCHC: 33.9 g/dL (ref 31.5–35.7)
MCV: 90 fL (ref 79–97)
Monocytes Absolute: 0.4 10*3/uL (ref 0.1–0.9)
Monocytes: 6 %
Neutrophils Absolute: 4.5 10*3/uL (ref 1.4–7.0)
Neutrophils: 71 %
Platelets: 271 10*3/uL (ref 150–450)
RBC: 4.65 x10E6/uL (ref 3.77–5.28)
RDW: 11.7 % (ref 11.7–15.4)
WBC: 6.4 10*3/uL (ref 3.4–10.8)

## 2020-04-08 LAB — LIPID PANEL
Chol/HDL Ratio: 2.9 ratio (ref 0.0–4.4)
Cholesterol, Total: 151 mg/dL (ref 100–199)
HDL: 52 mg/dL (ref >39)
LDL Chol Calc (NIH): 89 mg/dL (ref 0–99)
Triglycerides: 46 mg/dL (ref 0–149)
VLDL Cholesterol Cal: 10 mg/dL (ref 5–40)

## 2020-04-08 LAB — HEPATITIS C ANTIBODY: Hep C Virus Ab: <0.1 s/co ratio (ref 0.0–0.9)

## 2020-04-08 LAB — HIV ANTIBODY (ROUTINE TESTING W REFLEX): HIV Screen 4th Generation wRfx: NONREACTIVE

## 2020-04-12 ENCOUNTER — Other Ambulatory Visit: Payer: Self-pay

## 2020-04-12 ENCOUNTER — Ambulatory Visit (AMBULATORY_SURGERY_CENTER): Payer: Self-pay | Admitting: *Deleted

## 2020-04-12 VITALS — Ht 72.0 in | Wt 174.0 lb

## 2020-04-12 DIAGNOSIS — Z1211 Encounter for screening for malignant neoplasm of colon: Secondary | ICD-10-CM

## 2020-04-12 MED ORDER — SUTAB 1479-225-188 MG PO TABS: 1.0000 | ORAL_TABLET | ORAL | 0 refills | Status: DC

## 2020-04-12 NOTE — Progress Notes (Signed)
Patient is here in-person for PV. Patient denies any allergies to eggs or soy. Patient denies any problems with anesthesia/sedation. Patient denies any oxygen use at home. Patient denies taking any diet/weight loss medications or blood thinners. Patient is not being treated for MRSA or C-diff. Patient is aware of our care-partner policy and Covid-19 safety protocol. EMMI education assigned to the patient for the procedure, sent to MyChart.   Patient is fully COVID-19 vaccinated, per patient.   Prep Prescription coupon given to the patient. 

## 2020-04-13 ENCOUNTER — Ambulatory Visit: Payer: Self-pay | Admitting: Obstetrics & Gynecology

## 2020-04-22 ENCOUNTER — Encounter: Payer: Self-pay | Admitting: Gastroenterology

## 2020-04-26 ENCOUNTER — Ambulatory Visit (AMBULATORY_SURGERY_CENTER): Payer: BC Managed Care – PPO | Admitting: Gastroenterology

## 2020-04-26 ENCOUNTER — Other Ambulatory Visit: Payer: Self-pay

## 2020-04-26 ENCOUNTER — Encounter: Payer: Self-pay | Admitting: Gastroenterology

## 2020-04-26 VITALS — BP 116/73 | HR 70 | Temp 98.2°F | Resp 28 | Ht 72.0 in | Wt 174.0 lb

## 2020-04-26 DIAGNOSIS — D125 Benign neoplasm of sigmoid colon: Secondary | ICD-10-CM | POA: Diagnosis not present

## 2020-04-26 DIAGNOSIS — D127 Benign neoplasm of rectosigmoid junction: Secondary | ICD-10-CM | POA: Diagnosis not present

## 2020-04-26 DIAGNOSIS — D122 Benign neoplasm of ascending colon: Secondary | ICD-10-CM

## 2020-04-26 DIAGNOSIS — Z1211 Encounter for screening for malignant neoplasm of colon: Secondary | ICD-10-CM | POA: Diagnosis not present

## 2020-04-26 DIAGNOSIS — D120 Benign neoplasm of cecum: Secondary | ICD-10-CM

## 2020-04-26 MED ORDER — SODIUM CHLORIDE 0.9 % IV SOLN: 500.0000 mL | Freq: Once | INTRAVENOUS | Status: DC

## 2020-04-26 NOTE — Op Note (Signed)
[place] Patient Name: [name] Procedure Date: 04/26/2020 11:07 AM MRN: [id number] Endoscopist: Remo Lipps P. Havery Moros , MD Age: 55 Referring MD:  Date of Birth: Feb 18, 1965 Gender: Female Account #: [id number] Procedure:                Colonoscopy Indications:              Screening for colorectal malignant neoplasm, This                            is the patient's first colonoscopy Medicines:                Monitored Anesthesia Care Procedure:                Pre-Anesthesia Assessment:                           - Prior to the procedure, a History and Physical                            was performed, and patient medications and                            allergies were reviewed. The patient's tolerance of                            previous anesthesia was also reviewed. The risks                            and benefits of the procedure and the sedation                            options and risks were discussed with the patient.                            All questions were answered, and informed consent                            was obtained. Prior Anticoagulants: The patient has                            taken no previous anticoagulant or antiplatelet                            agents. ASA Grade Assessment: II - A patient with                            mild systemic disease. After reviewing the risks                            and benefits, the patient was deemed in                            satisfactory condition to undergo the procedure.  After obtaining informed consent, the colonoscope                            was passed under direct vision. Throughout the                            procedure, the patient's blood pressure, pulse, and                            oxygen saturations were monitored continuously. The                            Olympus [id number]) Colonoscope was                            introduced through  the anus and advanced to the the                            cecum, identified by appendiceal orifice and                            ileocecal valve. The colonoscopy was performed                            without difficulty. The patient tolerated the                            procedure well. The quality of the bowel                            preparation was good. The ileocecal valve,                            appendiceal orifice, and rectum were photographed. Scope In: 11:14:08 AM Scope Out: 11:45:19 AM Scope Withdrawal Time: 0 hours 24 minutes 58 seconds  Total Procedure Duration: 0 hours 31 minutes 11 seconds  Findings:                 The perianal and digital rectal examinations were                            normal.                           A 5 mm polyp was found in the cecum. The polyp was                            flat. The polyp was removed with a cold snare.                            Resection and retrieval were complete.                           A 3 mm polyp was found in the ascending colon. The  polyp was sessile. The polyp was removed with a                            cold snare. Resection and retrieval were complete.                           A 6 mm polyp was found in the sigmoid colon. The                            polyp was sessile. The polyp was removed with a                            cold snare. Resection and retrieval were complete.                           A 20 mm polyp was found in the distal sigmoid                            colon. The polyp was pedunculated. The polyp was                            removed with a hot snare. Resection and retrieval                            were complete. Area was tattooed with an injection                            of Spot (carbon black).                           A 5 mm polyp was found in the recto-sigmoid colon.                            The polyp was pedunculated. The polyp was removed                             with a hot snare. Resection and retrieval were                            complete.                           Internal hemorrhoids were found during                            retroflexion. The hemorrhoids were small.                           The exam was otherwise without abnormality. Complications:            No immediate complications. Estimated blood loss:                            Minimal. Estimated Blood  Loss:     Estimated blood loss was minimal. Impression:               - One 5 mm polyp in the cecum, removed with a cold                            snare. Resected and retrieved.                           - One 3 mm polyp in the ascending colon, removed                            with a cold snare. Resected and retrieved.                           - One 6 mm polyp in the sigmoid colon, removed with                            a cold snare. Resected and retrieved.                           - One 20 mm polyp in the distal sigmoid colon,                            removed with a hot snare. Resected and retrieved.                            Tattooed.                           - One 5 mm polyp at the recto-sigmoid colon,                            removed with a hot snare. Resected and retrieved.                           - Internal hemorrhoids.                           - The examination was otherwise normal. Recommendation:           - Patient has a contact number available for                            emergencies. The signs and symptoms of potential                            delayed complications were discussed with the                            patient. Return to normal activities tomorrow.                            Written discharge instructions were provided to the  patient.                           - Resume previous diet.                           - Continue present medications.                           - Await pathology  results.                           - No ibuprofen, naproxen, or other non-steroidal                            anti-inflammatory drugs for 2 weeks after polyp                            removal. Remo Lipps P. Philicia Heyne, MD 04/26/2020 11:52:14 AM This report has been signed electronically.

## 2020-04-26 NOTE — Progress Notes (Signed)
Called to room to assist during endoscopic procedure.  Patient ID and intended procedure confirmed with present staff. Received instructions for my participation in the procedure from the performing physician.  

## 2020-04-26 NOTE — Patient Instructions (Signed)
NO ASPIRIN, ASPIRIN CONTAINING PRODUCTS (BC OR GOODY POWDERS) OR NSAIDS (IBUPROFEN, ADVIL, ALEVE, AND MOTRIN) FOR 2 weeks after polyp removal ; TYLENOL IS OK TO TAKE    Handouts on polyps & hemorrhoids given to you today  Await pathology results on polyps removed    YOU HAD AN ENDOSCOPIC PROCEDURE TODAY AT [place]:   Refer to the procedure report that was given to you for any specific questions about what was found during the examination.  If the procedure report does not answer your questions, please call your gastroenterologist to clarify.  If you requested that your care partner not be given the details of your procedure findings, then the procedure report has been included in a sealed envelope for you to review at your convenience later.  YOU SHOULD EXPECT: Some feelings of bloating in the abdomen. Passage of more gas than usual.  Walking can help get rid of the air that was put into your GI tract during the procedure and reduce the bloating. If you had a lower endoscopy (such as a colonoscopy or flexible sigmoidoscopy) you may notice spotting of blood in your stool or on the toilet paper. If you underwent a bowel prep for your procedure, you may not have a normal bowel movement for a few days.  Please Note:  You might notice some irritation and congestion in your nose or some drainage.  This is from the oxygen used during your procedure.  There is no need for concern and it should clear up in a day or so.  SYMPTOMS TO REPORT IMMEDIATELY:   Following lower endoscopy (colonoscopy or flexible sigmoidoscopy):  Excessive amounts of blood in the stool  Significant tenderness or worsening of abdominal pains  Swelling of the abdomen that is new, acute  Fever of 100F or higher    For urgent or emergent issues, a gastroenterologist can be reached at any hour by calling [phone]. Do not use MyChart messaging for urgent concerns.    DIET:  We do recommend a  small meal at first, but then you may proceed to your regular diet.  Drink plenty of fluids but you should avoid alcoholic beverages for 24 hours.  ACTIVITY:  You should plan to take it easy for the rest of today and you should NOT DRIVE or use heavy machinery until tomorrow (because of the sedation medicines used during the test).    FOLLOW UP: Our staff will call the number listed on your records 48-72 hours following your procedure to check on you and address any questions or concerns that you may have regarding the information given to you following your procedure. If we do not reach you, we will leave a message.  We will attempt to reach you two times.  During this call, we will ask if you have developed any symptoms of COVID 19. If you develop any symptoms (ie: fever, flu-like symptoms, shortness of breath, cough etc.) before then, please call [phone].  If you test positive for Covid 19 in the 2 weeks post procedure, please call and report this information to [place].    If any biopsies were taken you will be contacted by phone or by letter within the next 1-3 weeks.  Please call us at [phone] if you have not heard about the biopsies in 3 weeks.    SIGNATURES/CONFIDENTIALITY: You and/or your care partner have signed paperwork which will be entered into your electronic medical record.  These signatures attest to  the fact that that the information above on your After Visit Summary has been reviewed and is understood.  Full responsibility of the confidentiality of this discharge information lies with you and/or your care-partner.

## 2020-04-26 NOTE — Progress Notes (Signed)
Report given to PACU, vss 

## 2020-04-26 NOTE — Progress Notes (Signed)
VS-CW  Pt's states no medical or surgical changes since previsit or office visit.  

## 2020-04-28 ENCOUNTER — Telehealth: Payer: Self-pay

## 2020-04-28 NOTE — Telephone Encounter (Signed)
  Follow up Call-  Call back number 04/26/2020  Post procedure Call Back phone  # [phone]  Permission to leave phone message Yes  Some recent data might be hidden     Patient questions:  Do you have a fever, pain , or abdominal swelling? No. Pain Score  0 *  Have you tolerated food without any problems? Yes.    Have you been able to return to your normal activities? Yes.    Do you have any questions about your discharge instructions: Diet   No. Medications  No. Follow up visit  No.  Do you have questions or concerns about your Care? No.  Actions: * If pain score is 4 or above: No action needed, pain <4.  1. Have you developed a fever since your procedure? no  2.   Have you had an respiratory symptoms (SOB or cough) since your procedure?no 3.   Have you tested positive for COVID 19 since your procedure no  4.   Have you had any family members/close contacts diagnosed with the COVID 19 since your procedure?  no   If yes to any of these questions please route to Joylene John, RN and Joella Prince, RN

## 2020-05-03 DIAGNOSIS — C44311 Basal cell carcinoma of skin of nose: Secondary | ICD-10-CM | POA: Diagnosis not present

## 2020-05-17 ENCOUNTER — Telehealth: Payer: Self-pay

## 2020-05-17 NOTE — Telephone Encounter (Signed)
Patient called in to discuss her pathology results. Advised that a message was sent via My Chart. I read message from Dr. Havery Moros. Answered all of patient's questions, she verbalized understanding and had no concerns at the end of the call.

## 2020-05-18 ENCOUNTER — Ambulatory Visit (INDEPENDENT_AMBULATORY_CARE_PROVIDER_SITE_OTHER): Payer: BC Managed Care – PPO | Admitting: Obstetrics & Gynecology

## 2020-05-18 ENCOUNTER — Other Ambulatory Visit: Payer: Self-pay

## 2020-05-18 ENCOUNTER — Encounter: Payer: Self-pay | Admitting: Obstetrics & Gynecology

## 2020-05-18 ENCOUNTER — Other Ambulatory Visit (HOSPITAL_COMMUNITY)
Admission: RE | Admit: 2020-05-18 | Discharge: 2020-05-18 | Disposition: A | Discharge status: Home / Self Care | Payer: BC Managed Care – PPO | Source: Ambulatory Visit | Attending: Obstetrics & Gynecology | Admitting: Obstetrics & Gynecology

## 2020-05-18 VITALS — BP 110/62 | HR 78 | Ht 72.0 in | Wt 178.0 lb

## 2020-05-18 DIAGNOSIS — Z01419 Encounter for gynecological examination (general) (routine) without abnormal findings: Secondary | ICD-10-CM | POA: Diagnosis not present

## 2020-05-18 DIAGNOSIS — Z3041 Encounter for surveillance of contraceptive pills: Secondary | ICD-10-CM | POA: Diagnosis not present

## 2020-05-18 LAB — RESULTS CONSOLE HPV: CHL HPV: NEGATIVE

## 2020-05-18 LAB — HM PAP SMEAR: HM Pap smear: NEGATIVE

## 2020-05-18 MED ORDER — NORETHINDRONE 0.35 MG PO TABS: 1.0000 | ORAL_TABLET | Freq: Every day | ORAL | 4 refills | Status: DC

## 2020-05-18 NOTE — Progress Notes (Signed)
[name] September 20, 1965 [id number]   History:    55 y.o. G1P1L1 Married. Son is 53 yo Administrator, arts in Secretary/administrator at [employer]  VF:[id number] presenting for annual gyn exam   ZSW:FUXN on the [place]. Menses present.  Easy BTB if delays a pill. No pelvic pain. No pain with intercourse. Breast normal.cHTN on Norvasc 2.5 mg daily, well controled.Body mass index 24.14. Walking daily. Health labs with family physician.  Past medical history,surgical history, family history and social history were all reviewed and documented in the EPIC chart.  Gynecologic History Patient's last menstrual period was 05/16/2020.  Obstetric History OB History  Gravida Para Term Preterm AB Living  1 1       1   SAB IAB Ectopic Multiple Live Births               # Outcome Date GA Lbr Len/2nd Weight Sex Delivery Anes PTL Lv  1 Para              ROS: A ROS was performed and pertinent positives and negatives are included in the history.  GENERAL: No fevers or chills. HEENT: No change in vision, no earache, sore throat or sinus congestion. NECK: No pain or stiffness. CARDIOVASCULAR: No chest pain or pressure. No palpitations. PULMONARY: No shortness of breath, cough or wheeze. GASTROINTESTINAL: No abdominal pain, nausea, vomiting or diarrhea, melena or bright red blood per rectum. GENITOURINARY: No urinary frequency, urgency, hesitancy or dysuria. MUSCULOSKELETAL: No joint or muscle pain, no back pain, no recent trauma. DERMATOLOGIC: No rash, no itching, no lesions. ENDOCRINE: No polyuria, polydipsia, no heat or cold intolerance. No recent change in weight. HEMATOLOGICAL: No anemia or easy bruising or bleeding. NEUROLOGIC: No headache, seizures, numbness, tingling or weakness. PSYCHIATRIC: No depression, no loss of interest in normal activity or change in sleep pattern.     Exam:   BP 110/62   Pulse 78   Ht 6' (1.829 m)   Wt 178 lb (80.7 kg)    LMP 05/16/2020   SpO2 98%   BMI 24.14 kg/m   Body mass index is 24.14 kg/m.  General appearance : Well developed well nourished female. No acute distress HEENT: Eyes: no retinal hemorrhage or exudates,  Neck supple, trachea midline, no carotid bruits, no thyroidmegaly Lungs: Clear to auscultation, no rhonchi or wheezes, or rib retractions  Heart: Regular rate and rhythm, no murmurs or gallops Breast:Examined in sitting and supine position were symmetrical in appearance, no palpable masses or tenderness,  no skin retraction, no nipple inversion, no nipple discharge, no skin discoloration, no axillary or supraclavicular lymphadenopathy Abdomen: no palpable masses or tenderness, no rebound or guarding Extremities: no edema or skin discoloration or tenderness  Pelvic: Vulva: Normal             Vagina: No gross lesions or discharge  Cervix: No gross lesions or discharge.  Pap reflex done.  Uterus  AV, stable size about 9 cm, non-tender and mobile  Adnexa  Without masses or tenderness  Anus: Normal   Assessment/Plan:  55 y.o. female for annual exam   1. Encounter for routine gynecological examination with Papanicolaou smear of cervix Stable gynecologic exam.  Pap reflex done.  Breast exam normal.  Screening mammogram negative in March 2022.  Colonoscopy with precancer polyps removed in 2022.  We will repeat a colonoscopy at 3 years.  Health labs with family physician.  Followed for chronic hypertension. - Cytology - PAP( [place])  2.  Encounter for surveillance of contraceptive pills Well on the progestin only pill.  No contraindication to continue.  Prescription sent to pharmacy.  Other orders - norethindrone (MICRONOR) 0.35 MG tablet; Take 1 tablet (0.35 mg total) by mouth daily.  [name] MD, 10:46 AM 05/18/2020

## 2020-05-24 LAB — CYTOLOGY - PAP
Comment: NEGATIVE
Diagnosis: NEGATIVE
High risk HPV: NEGATIVE

## 2020-06-22 DIAGNOSIS — Z4801 Encounter for change or removal of surgical wound dressing: Secondary | ICD-10-CM | POA: Diagnosis not present

## 2020-07-14 ENCOUNTER — Ambulatory Visit (INDEPENDENT_AMBULATORY_CARE_PROVIDER_SITE_OTHER): Payer: BC Managed Care – PPO | Admitting: Family Medicine

## 2020-07-14 ENCOUNTER — Encounter: Payer: Self-pay | Admitting: Family Medicine

## 2020-07-14 ENCOUNTER — Other Ambulatory Visit: Payer: Self-pay

## 2020-07-14 VITALS — BP 120/84 | HR 68 | Ht 72.0 in | Wt 183.8 lb

## 2020-07-14 DIAGNOSIS — R1909 Other intra-abdominal and pelvic swelling, mass and lump: Secondary | ICD-10-CM | POA: Diagnosis not present

## 2020-07-14 NOTE — Patient Instructions (Signed)
We are arranging for an ultrasound (to start) to evaluate the lump you can feel in the lower stomach. We will see if further evaluation (ie CT) is needed or not.

## 2020-07-14 NOTE — Progress Notes (Signed)
Chief Complaint  Patient presents with   Mass    Lump(hard) under her belly button, noticed a couple of months ago-no pain.    Shortly before her colonoscopy in April, she noticed a lump in her midline lower abdomen (between umbilicus and pubic bone)--noticed it when she pressed down when her stomach was growling. Can only feel it when she is laying down.  Doesn't notice any change since she first noticed it, doesn't come/go. Not painful. Doesn't know how long it has been there, just happened to press on it and feel it when laying down as her stomach was growling.  GYN Dr. Dellis Filbert-- Has had cervical polyps removed in past. Unsure if told about fibroids. She recalls having [place] at GYN's office--prior to being with [facility].  No records in chart (looked throughout [facility] records and scanned records, no mention of [place]) Uterus reportedly normal on GYN exams, no mention of enlargement or fibroids.  She is not yet menopausal.  She has had more frequent bleeding/spotting, nothing heavy. She is still on the mini-pill.  PMH, PSH, SH reviewed  Outpatient Encounter Medications as of 07/14/2020  Medication Sig   amLODipine (NORVASC) 2.5 MG tablet Take 1 tablet (2.5 mg total) by mouth daily.   cholecalciferol (VITAMIN D) 1000 units tablet Take 2,000 Units daily by mouth.    famotidine (PEPCID) 20 MG tablet Take 20 mg by mouth as needed for heartburn or indigestion.   fexofenadine (ALLEGRA) 180 MG tablet Take 180 mg by mouth daily. Reported on 06/22/2015   norethindrone (MICRONOR) 0.35 MG tablet Take 1 tablet (0.35 mg total) by mouth daily.   acetaminophen (TYLENOL) 325 MG tablet Take 650 mg by mouth as needed for pain. Reported on 06/22/2015 (Patient not taking: Reported on 07/14/2020)   tretinoin (RETIN-A) 5.784 % cream APPLICATIONS APPLY A PEA SIZED AMOUNT TO FACE AT BEDTIME (Patient not taking: Reported on 07/14/2020)   No facility-administered encounter medications on file as of 07/14/2020.   No Known  Allergies  ROS: no fever, chills, URI symptoms. No n/v/d or constipation. No abdominal pain.  No vaginal discharge. Some irregular spotting/bleeding per HPI. No urinary complaints.   PHYSICAL EXAM:  BP 120/84   Pulse 68   Ht 6' (1.829 m)   Wt 183 lb 12.8 oz (83.4 kg)   BMI 24.93 kg/m   Pleasant, well-appearing female in no distress Heart: regular rate and rhythm, no murmur Lungs: clear bilaterally Abdomen: soft, normal bowel sounds, no organomegaly. 1.5-2 inches inferior to umbilicus there is a ridge of firmness (?2 nodular areas next to each other vs a more oblong one)  The area below this also feels firmer than normal. Nontender. No suprapubic tenderness. Extremities: no edema Psych: normal mood, affect, hygiene and grooming Neuro: alert and oriented, normal strength, gait  ASSESSMENT/PLAN:  Abdominal mass of other site - midline, lower abdomen.  Will start with pelvic US, to eval palpable abnl, r/o fibroids or other etiology. May need CT, based on results - Plan: US Pelvic Complete With Transvaginal  If any GYN pathology, will forward info to Dr. Dellis Filbert.

## 2020-07-28 ENCOUNTER — Ambulatory Visit
Admission: RE | Admit: 2020-07-28 | Discharge: 2020-07-28 | Disposition: A | Discharge status: Home / Self Care | Payer: BC Managed Care – PPO | Source: Ambulatory Visit | Attending: Family Medicine | Admitting: Family Medicine

## 2020-07-28 DIAGNOSIS — N83202 Unspecified ovarian cyst, left side: Secondary | ICD-10-CM | POA: Diagnosis not present

## 2020-07-28 DIAGNOSIS — N852 Hypertrophy of uterus: Secondary | ICD-10-CM | POA: Diagnosis not present

## 2020-07-28 DIAGNOSIS — R1909 Other intra-abdominal and pelvic swelling, mass and lump: Secondary | ICD-10-CM

## 2020-07-28 DIAGNOSIS — D251 Intramural leiomyoma of uterus: Secondary | ICD-10-CM | POA: Diagnosis not present

## 2020-07-28 DIAGNOSIS — D252 Subserosal leiomyoma of uterus: Secondary | ICD-10-CM | POA: Diagnosis not present

## 2020-07-28 DIAGNOSIS — R1900 Intra-abdominal and pelvic swelling, mass and lump, unspecified site: Secondary | ICD-10-CM | POA: Diagnosis not present

## 2020-08-03 ENCOUNTER — Encounter: Payer: Self-pay | Admitting: Family Medicine

## 2020-08-25 ENCOUNTER — Telehealth: Payer: Self-pay

## 2020-08-25 DIAGNOSIS — N83202 Unspecified ovarian cyst, left side: Secondary | ICD-10-CM

## 2020-08-25 NOTE — Telephone Encounter (Signed)
My Chart message from patient  "I most likely need to set up an appointment at sometime for follow up after having had a pelvic ultrasound [facility] 7/27), that indicated that I had both uterine fibroids and a cyst on my left ovary.  (You may be able to see the results of the procedure since it is a [facility] facility).   It is my understanding (via my primary doctor Rita Ohara) that the fibroids do not require any follow up/additional sonogram.  However, I do have some questions about the findings, both fibroid and cyst related, in terms of long term health impacts including any possible cancer concerns. Would you recommend that I get an additional sonogram to monitor any changes in the ovarian cyst, and if so - when.  Also, should I follow up with you after of before the next sonogram? Thank you!"   U/S report is in the Epic Chart.

## 2020-08-27 NOTE — Telephone Encounter (Signed)
Order placed, my chart message sent relaying the below

## 2020-08-27 NOTE — Telephone Encounter (Signed)
Please schedule Pelvic US here with me in 11/2020 to f/u on Left Ovarian Cyst.

## 2020-09-01 NOTE — Telephone Encounter (Signed)
Patient scheduled on 11/11/20

## 2020-09-29 ENCOUNTER — Encounter: Payer: Self-pay | Admitting: *Deleted

## 2020-10-28 DIAGNOSIS — Z85828 Personal history of other malignant neoplasm of skin: Secondary | ICD-10-CM | POA: Diagnosis not present

## 2020-10-28 DIAGNOSIS — L918 Other hypertrophic disorders of the skin: Secondary | ICD-10-CM | POA: Diagnosis not present

## 2020-10-28 DIAGNOSIS — L738 Other specified follicular disorders: Secondary | ICD-10-CM | POA: Diagnosis not present

## 2020-10-28 DIAGNOSIS — C44519 Basal cell carcinoma of skin of other part of trunk: Secondary | ICD-10-CM | POA: Diagnosis not present

## 2020-10-28 DIAGNOSIS — L821 Other seborrheic keratosis: Secondary | ICD-10-CM | POA: Diagnosis not present

## 2020-11-03 DIAGNOSIS — C4491 Basal cell carcinoma of skin, unspecified: Secondary | ICD-10-CM

## 2020-11-03 HISTORY — DX: Basal cell carcinoma of skin, unspecified: C44.91

## 2020-11-11 ENCOUNTER — Ambulatory Visit (INDEPENDENT_AMBULATORY_CARE_PROVIDER_SITE_OTHER): Payer: BC Managed Care – PPO

## 2020-11-11 ENCOUNTER — Encounter: Payer: Self-pay | Admitting: Obstetrics & Gynecology

## 2020-11-11 ENCOUNTER — Encounter: Payer: Self-pay | Admitting: *Deleted

## 2020-11-11 ENCOUNTER — Other Ambulatory Visit: Payer: Self-pay

## 2020-11-11 ENCOUNTER — Ambulatory Visit (INDEPENDENT_AMBULATORY_CARE_PROVIDER_SITE_OTHER): Payer: BC Managed Care – PPO | Admitting: Obstetrics & Gynecology

## 2020-11-11 VITALS — BP 120/78

## 2020-11-11 DIAGNOSIS — N83202 Unspecified ovarian cyst, left side: Secondary | ICD-10-CM

## 2020-11-11 DIAGNOSIS — N840 Polyp of corpus uteri: Secondary | ICD-10-CM

## 2020-11-11 DIAGNOSIS — N841 Polyp of cervix uteri: Secondary | ICD-10-CM

## 2020-11-11 DIAGNOSIS — D252 Subserosal leiomyoma of uterus: Secondary | ICD-10-CM | POA: Diagnosis not present

## 2020-11-11 DIAGNOSIS — D251 Intramural leiomyoma of uterus: Secondary | ICD-10-CM

## 2020-11-11 NOTE — Progress Notes (Signed)
    [name] 17-Mar-1965 [id number]        55 y.o.  G1P1L1   RP: Left Ovarian Cyst for Pelvic US  HPI: Left Ovarian Cyst f/u.  No pelvic pain.  Perimenopausal Oligomenorrhea on the Progestin only pill.  H/O Fibroids.    OB History  Gravida Para Term Preterm AB Living  1 1       1   SAB IAB Ectopic Multiple Live Births               # Outcome Date GA Lbr Len/2nd Weight Sex Delivery Anes PTL Lv  1 Para             Past medical history,surgical history, problem list, medications, allergies, family history and social history were all reviewed and documented in the EPIC chart.   Directed ROS with pertinent positives and negatives documented in the history of present illness/assessment and plan.  Exam:  Vitals:   11/11/20 1014  BP: 120/78   General appearance:  Normal  Pelvic US today: T/V images.  Comparison made with previous scan July 28, 2020.  Anteverted enlarged fibroid uterus measured at 12 x 10.13 x 16.38 cm.  Right lateral posterior subserosal fibroid measured at 7.9 x 8.9 cm with no significant change from previous scan.  Left lateral anterior subserosal fibroid noted measured at 4 x 5 cm with no significant change from previous scan.  Echogenic area noted in cervical canal measured at 1.9 x 0.5 cm with a feeder vessel, probable cervical polyp.  Thin symmetrical endometrium with no mass or thickening seen.  Right ovary is within normal limits.  Left ovary is enlarged with an avascular cystic area, with a solid component.  The cyst is measured at 2.4 x 3.8 cm which is slightly larger than on previous scan.  No free fluid in the pelvis.   Assessment/Plan:  55 y.o. G1P1   1. Left ovarian cyst Avascular left ovarian cyst with a small solid area with no significant change since July 2022.  Patient is asymptomatic.  Will check Ca1 25 today.  If normal, will repeat a pelvic ultrasound in 6 months.  Laparoscopic left oophorectomy discussed.  Patient voiced understanding and  agreement with the plan to observe at this time. - CA 125 - US Transvaginal Non-OB; Future  2. Intramural and subserous leiomyoma of uterus Stable asymptomatic fibroids.  Observing.  3. Endocervical polyp Ultrasound finding of an endocervical polyp reviewed with patient.  Recommend excision.  We will schedule HSC/Myosure Excision/D+C.  Other orders - VITAMIN D PO; Take 2,000 Int'l Units by mouth.   [name] MD, 10:57 AM 11/11/2020

## 2020-11-12 ENCOUNTER — Telehealth: Payer: Self-pay | Admitting: *Deleted

## 2020-11-12 LAB — CA 125: CA 125: 13 U/mL (ref <35)

## 2020-11-12 NOTE — Telephone Encounter (Signed)
-----   Message from [name], MD sent at 11/11/2020 10:58 AM EST ----- Regarding: Schedule surgery Surgery:  Hysteroscopy with Myosure Excision and Dilation and Curettage  Diagnosis: Breakthrough bleeding with Endometrial Polyp  Location: [place]  Status: Outpatient  Time: 30 Minutes  Assistant: N/A  Urgency: First Available  Pre-Op Appointment: To Be Scheduled  Post-Op Appointment(s): 2 Weeks  Time Out Of Work: Day Of Surgery ONLY

## 2020-11-14 ENCOUNTER — Encounter: Payer: Self-pay | Admitting: Obstetrics & Gynecology

## 2020-11-15 NOTE — Telephone Encounter (Signed)
-----   Message from [name], MD sent at 11/14/2020  9:57 AM EST ----- Regarding: FW: Schedule surgery Please change the Dx to Endocervical Polyp instead of Endometrial. ----- Message ----- From: [name], MD Sent: 11/11/2020  11:06 AM EST To: [facility]- Gynecology Surgery Subject: Schedule surgery                               Surgery:  Hysteroscopy with Myosure Excision and Dilation and Curettage  Diagnosis: Breakthrough bleeding with Endometrial Polyp  Location: [place]  Status: Outpatient  Time: 30 Minutes  Assistant: N/A  Urgency: First Available  Pre-Op Appointment: To Be Scheduled  Post-Op Appointment(s): 2 Weeks  Time Out Of Work: Day Of Surgery ONLY

## 2020-11-15 NOTE — Telephone Encounter (Signed)
Spoke with patient. Reviewed surgery dates. Patient request to proceed with surgery on 12/17/20.  I will return call once surgery date and time confirmed. Patient verbalizes understanding and is agreeable.   Surgery request sent.

## 2020-11-15 NOTE — Telephone Encounter (Signed)
Spoke with patient regarding surgery benefits. Patient acknowledges understanding of information presented. Patient is aware that benefits presented are professional benefits only. Patient is aware the hospital will call with facility benefits. See account note.  Routing to Jill Hamm, RN.  

## 2020-11-16 NOTE — Telephone Encounter (Signed)
Spoke with patient. Surgery date request confirmed.  Advised surgery is scheduled for 12/17/20, [facility] at 0830. Surgery instruction sheet and hospital brochure reviewed, printed copy will be mailed.Patient advised if Covid screening and quarantine requirements and agreeable.   Routing to provider. Encounter closed.  Cc: Hayley Carder

## 2020-12-13 ENCOUNTER — Other Ambulatory Visit: Payer: Self-pay

## 2020-12-13 ENCOUNTER — Encounter: Payer: Self-pay | Admitting: Obstetrics & Gynecology

## 2020-12-13 ENCOUNTER — Encounter (HOSPITAL_BASED_OUTPATIENT_CLINIC_OR_DEPARTMENT_OTHER): Payer: Self-pay | Admitting: Obstetrics & Gynecology

## 2020-12-13 ENCOUNTER — Ambulatory Visit (INDEPENDENT_AMBULATORY_CARE_PROVIDER_SITE_OTHER): Payer: BC Managed Care – PPO | Admitting: Obstetrics & Gynecology

## 2020-12-13 VITALS — BP 112/82 | HR 91

## 2020-12-13 DIAGNOSIS — D252 Subserosal leiomyoma of uterus: Secondary | ICD-10-CM

## 2020-12-13 DIAGNOSIS — N841 Polyp of cervix uteri: Secondary | ICD-10-CM

## 2020-12-13 DIAGNOSIS — N83202 Unspecified ovarian cyst, left side: Secondary | ICD-10-CM

## 2020-12-13 DIAGNOSIS — D251 Intramural leiomyoma of uterus: Secondary | ICD-10-CM | POA: Diagnosis not present

## 2020-12-13 NOTE — Progress Notes (Signed)
Spoke w/ via phone for pre-op interview--- [name] needs dos----       Preop labs and EKG 12/13 appt. In Epic        Lab results------ COVID test -----patient states asymptomatic no test needed Arrive at ------- 0630 NPO after MN NO Solid Food.  Clear liquids from MN until--- 0530 Med rec completed Medications to take morning of surgery ----- NONE Diabetic medication ----- Patient instructed no nail polish to be worn day of surgery Patient instructed to bring photo id and insurance card day of surgery Patient aware to have Driver (ride ) / caregiver    for 24 hours after surgery  Patient Special Instructions ----- Pre-Op special Istructions ----- Patient verbalized understanding of instructions that were given at this phone interview. Patient denies shortness of breath, chest pain, fever, cough at this phone interview.

## 2020-12-13 NOTE — Progress Notes (Signed)
[name] Mar 27, 1965 [id number]        55 y.o.  G1P1L1  RP: Preop HSC/Myosure Excision of Polyp/D+C on 12/17/2020  HPI: Perimenopausal Oligomenorrhea on the Progestin only pill, but developed very frequent BTB.  Endocervical Polyp present on Pelvic US.  H/O Fibroids IM and SS, no heavy menses or pain, observing. Had a Pelvic US for f/u Left Ovarian Cyst on 11/11/2020, stable with no pelvic pain, decision to observe.   OB History  Gravida Para Term Preterm AB Living  1 1       1   SAB IAB Ectopic Multiple Live Births               # Outcome Date GA Lbr Len/2nd Weight Sex Delivery Anes PTL Lv  1 Para             Past medical history,surgical history, problem list, medications, allergies, family history and social history were all reviewed and documented in the EPIC chart.   Directed ROS with pertinent positives and negatives documented in the history of present illness/assessment and plan.  Exam:  Vitals:   12/13/20 1445  BP: 112/82  Pulse: 91  SpO2: 99%   General appearance:  Normal  Pelvic US 11/11/2020: T/V images.  Comparison made with previous scan July 28, 2020.  Anteverted enlarged fibroid uterus measured at 12 x 10.13 x 16.38 cm.  Right lateral posterior subserosal fibroid measured at 7.9 x 8.9 cm with no significant change from previous scan.  Left lateral anterior subserosal fibroid noted measured at 4 x 5 cm with no significant change from previous scan.  Echogenic area noted in cervical canal measured at 1.9 x 0.5 cm with a feeder vessel, probable cervical polyp.  Thin symmetrical endometrium with no mass or thickening seen.  Right ovary is within normal limits.  Left ovary is enlarged with an avascular cystic area, with a solid component.  The cyst is measured at 2.4 x 3.8 cm which is slightly larger than on previous scan.  No free fluid in the pelvis.     Assessment/Plan:  55 y.o. G1P1    1. Endocervical polyp Ultrasound finding of an  endocervical polyp reviewed with patient.  Recommend excision.  HSC/Myosure Excision/D+C scheduled on 12/17/2020.  Preop preparation, surgery with risks and postop expectations and precautions reviewed.  Patient prefers Sedation with Paracervical Block, agree.  2. Intramural and subserous leiomyoma of uterus Stable asymptomatic fibroids.  Observing.   3. Left ovarian cyst Avascular left ovarian cyst with a small solid area with no significant change since July 2022.  Patient is asymptomatic. Ca125 Normal.  Pelvic ultrasound in 6 months.  Laparoscopic left oophorectomy discussed.  Patient voiced understanding and agreement with the plan to observe at this time.                         Patient was counseled as to the risk of surgery to include the following:  1. Infection (prohylactic antibiotics will be administered)  2. DVT/Pulmonary Embolism (prophylactic pneumo compression stockings will be used)  3.Trauma to internal organs requiring additional surgical procedure to repair any injury to internal organs requiring perhaps additional hospitalization days.  4.Hemmorhage requiring transfusion and blood products which carry risks such as anaphylactic reaction, hepatitis and AIDS  Patient had received literature information on the procedure scheduled and all her questions were answered and fully accepts all risk.    [name] MD, 3:26 PM 12/13/2020

## 2020-12-14 ENCOUNTER — Encounter (HOSPITAL_BASED_OUTPATIENT_CLINIC_OR_DEPARTMENT_OTHER)
Admission: RE | Admit: 2020-12-14 | Discharge: 2020-12-14 | Disposition: A | Discharge status: Home / Self Care | Payer: BC Managed Care – PPO | Source: Ambulatory Visit | Attending: Obstetrics & Gynecology | Admitting: Obstetrics & Gynecology

## 2020-12-14 VITALS — BP 136/88 | HR 71 | Temp 98.6°F | Resp 16 | Ht 72.0 in | Wt 180.0 lb

## 2020-12-14 DIAGNOSIS — I10 Essential (primary) hypertension: Secondary | ICD-10-CM | POA: Insufficient documentation

## 2020-12-14 DIAGNOSIS — N83202 Unspecified ovarian cyst, left side: Secondary | ICD-10-CM | POA: Diagnosis not present

## 2020-12-14 DIAGNOSIS — Z01818 Encounter for other preprocedural examination: Secondary | ICD-10-CM

## 2020-12-14 DIAGNOSIS — N840 Polyp of corpus uteri: Secondary | ICD-10-CM | POA: Diagnosis not present

## 2020-12-14 DIAGNOSIS — N938 Other specified abnormal uterine and vaginal bleeding: Secondary | ICD-10-CM | POA: Diagnosis not present

## 2020-12-14 DIAGNOSIS — R9389 Abnormal findings on diagnostic imaging of other specified body structures: Secondary | ICD-10-CM | POA: Diagnosis not present

## 2020-12-14 LAB — CBC
HCT: 42.7 % (ref 36.0–46.0)
Hemoglobin: 14.1 g/dL (ref 12.0–15.0)
MCH: 29.9 pg (ref 26.0–34.0)
MCHC: 33 g/dL (ref 30.0–36.0)
MCV: 90.7 fL (ref 80.0–100.0)
Platelets: 270 10*3/uL (ref 150–400)
RBC: 4.71 MIL/uL (ref 3.87–5.11)
RDW: 12.2 % (ref 11.5–15.5)
WBC: 6.4 10*3/uL (ref 4.0–10.5)
nRBC: 0 % (ref 0.0–0.2)

## 2020-12-14 LAB — BASIC METABOLIC PANEL
Anion gap: 9 (ref 5–15)
BUN: 11 mg/dL (ref 6–20)
CO2: 23 mmol/L (ref 22–32)
Calcium: 8.9 mg/dL (ref 8.9–10.3)
Chloride: 103 mmol/L (ref 98–111)
Creatinine, Ser: 0.67 mg/dL (ref 0.44–1.00)
GFR, Estimated: >60 mL/min (ref >60)
Glucose, Bld: 106 mg/dL — ABNORMAL HIGH (ref 70–99)
Potassium: 4 mmol/L (ref 3.5–5.1)
Sodium: 135 mmol/L (ref 135–145)

## 2020-12-17 ENCOUNTER — Encounter (HOSPITAL_BASED_OUTPATIENT_CLINIC_OR_DEPARTMENT_OTHER)
Admission: RE | Disposition: A | Discharge status: Home / Self Care | Payer: Self-pay | Source: Home / Self Care | Attending: Obstetrics & Gynecology

## 2020-12-17 ENCOUNTER — Other Ambulatory Visit: Payer: Self-pay

## 2020-12-17 ENCOUNTER — Ambulatory Visit (HOSPITAL_BASED_OUTPATIENT_CLINIC_OR_DEPARTMENT_OTHER): Payer: BC Managed Care – PPO | Admitting: Anesthesiology

## 2020-12-17 ENCOUNTER — Encounter (HOSPITAL_BASED_OUTPATIENT_CLINIC_OR_DEPARTMENT_OTHER): Payer: Self-pay | Admitting: Obstetrics & Gynecology

## 2020-12-17 ENCOUNTER — Ambulatory Visit (HOSPITAL_BASED_OUTPATIENT_CLINIC_OR_DEPARTMENT_OTHER)
Admission: RE | Admit: 2020-12-17 | Discharge: 2020-12-17 | Disposition: A | Discharge status: Home / Self Care | Payer: BC Managed Care – PPO | Attending: Obstetrics & Gynecology | Admitting: Obstetrics & Gynecology

## 2020-12-17 DIAGNOSIS — N938 Other specified abnormal uterine and vaginal bleeding: Secondary | ICD-10-CM | POA: Diagnosis not present

## 2020-12-17 DIAGNOSIS — E559 Vitamin D deficiency, unspecified: Secondary | ICD-10-CM | POA: Diagnosis not present

## 2020-12-17 DIAGNOSIS — N841 Polyp of cervix uteri: Secondary | ICD-10-CM | POA: Diagnosis not present

## 2020-12-17 DIAGNOSIS — N840 Polyp of corpus uteri: Secondary | ICD-10-CM | POA: Diagnosis not present

## 2020-12-17 DIAGNOSIS — R9389 Abnormal findings on diagnostic imaging of other specified body structures: Secondary | ICD-10-CM | POA: Insufficient documentation

## 2020-12-17 DIAGNOSIS — Z01818 Encounter for other preprocedural examination: Secondary | ICD-10-CM

## 2020-12-17 DIAGNOSIS — N83202 Unspecified ovarian cyst, left side: Secondary | ICD-10-CM | POA: Insufficient documentation

## 2020-12-17 DIAGNOSIS — I10 Essential (primary) hypertension: Secondary | ICD-10-CM | POA: Diagnosis not present

## 2020-12-17 HISTORY — DX: Essential (primary) hypertension: I10

## 2020-12-17 HISTORY — PX: DILATATION & CURETTAGE/HYSTEROSCOPY WITH MYOSURE: SHX6511

## 2020-12-17 LAB — POCT PREGNANCY, URINE: Preg Test, Ur: NEGATIVE

## 2020-12-17 SURGERY — DILATATION & CURETTAGE/HYSTEROSCOPY WITH MYOSURE
Anesthesia: Monitor Anesthesia Care | Site: Vagina

## 2020-12-17 MED ORDER — CEFAZOLIN SODIUM-DEXTROSE 2-4 GM/100ML-% IV SOLN
INTRAVENOUS | Status: AC
  Filled 2020-12-17: qty 100

## 2020-12-17 MED ORDER — FENTANYL CITRATE (PF) 100 MCG/2ML IJ SOLN
INTRAMUSCULAR | Status: DC | PRN
  Administered 2020-12-17 (×2): 50 ug via INTRAVENOUS

## 2020-12-17 MED ORDER — PROPOFOL 500 MG/50ML IV EMUL
INTRAVENOUS | Status: DC | PRN
  Administered 2020-12-17: 125 ug/kg/min via INTRAVENOUS

## 2020-12-17 MED ORDER — PROPOFOL 10 MG/ML IV BOLUS
INTRAVENOUS | Status: DC | PRN
  Administered 2020-12-17: 40 mg via INTRAVENOUS

## 2020-12-17 MED ORDER — FENTANYL CITRATE (PF) 100 MCG/2ML IJ SOLN: 25.0000 ug | INTRAMUSCULAR | Status: DC | PRN

## 2020-12-17 MED ORDER — SODIUM CHLORIDE 0.9 % IR SOLN
Status: DC | PRN
  Administered 2020-12-17: 3000 mL

## 2020-12-17 MED ORDER — KETOROLAC TROMETHAMINE 30 MG/ML IJ SOLN
INTRAMUSCULAR | Status: DC | PRN
  Administered 2020-12-17: 30 mg via INTRAVENOUS

## 2020-12-17 MED ORDER — ACETAMINOPHEN 500 MG PO TABS
ORAL_TABLET | ORAL | Status: AC
  Filled 2020-12-17: qty 2

## 2020-12-17 MED ORDER — LIDOCAINE HCL 1 % IJ SOLN
INTRAMUSCULAR | Status: DC | PRN
  Administered 2020-12-17: 20 mL

## 2020-12-17 MED ORDER — MIDAZOLAM HCL 2 MG/2ML IJ SOLN
INTRAMUSCULAR | Status: AC
  Filled 2020-12-17: qty 2

## 2020-12-17 MED ORDER — LIDOCAINE 2% (20 MG/ML) 5 ML SYRINGE
INTRAMUSCULAR | Status: AC
  Filled 2020-12-17: qty 5

## 2020-12-17 MED ORDER — CEFAZOLIN SODIUM-DEXTROSE 2-4 GM/100ML-% IV SOLN
2.0000 g | INTRAVENOUS | Status: AC
  Administered 2020-12-17: 2 g via INTRAVENOUS

## 2020-12-17 MED ORDER — POVIDONE-IODINE 10 % EX SWAB: 2.0000 "application " | Freq: Once | CUTANEOUS | Status: DC

## 2020-12-17 MED ORDER — LIDOCAINE HCL 1 % IJ SOLN
INTRAMUSCULAR | Status: AC
  Filled 2020-12-17: qty 20

## 2020-12-17 MED ORDER — ACETAMINOPHEN 500 MG PO TABS
1000.0000 mg | ORAL_TABLET | Freq: Once | ORAL | Status: AC
  Administered 2020-12-17: 1000 mg via ORAL

## 2020-12-17 MED ORDER — MIDAZOLAM HCL 5 MG/5ML IJ SOLN
INTRAMUSCULAR | Status: DC | PRN
  Administered 2020-12-17: 2 mg via INTRAVENOUS

## 2020-12-17 MED ORDER — KETOROLAC TROMETHAMINE 30 MG/ML IJ SOLN
INTRAMUSCULAR | Status: AC
  Filled 2020-12-17: qty 1

## 2020-12-17 MED ORDER — ONDANSETRON HCL 4 MG/2ML IJ SOLN
INTRAMUSCULAR | Status: DC | PRN
  Administered 2020-12-17: 4 mg via INTRAVENOUS

## 2020-12-17 MED ORDER — ORAL CARE MOUTH RINSE: 15.0000 mL | Freq: Once | OROMUCOSAL | Status: DC

## 2020-12-17 MED ORDER — CHLORHEXIDINE GLUCONATE 0.12 % MT SOLN: 15.0000 mL | Freq: Once | OROMUCOSAL | Status: DC

## 2020-12-17 MED ORDER — ONDANSETRON HCL 4 MG/2ML IJ SOLN
INTRAMUSCULAR | Status: AC
  Filled 2020-12-17: qty 2

## 2020-12-17 MED ORDER — LACTATED RINGERS IV SOLN: INTRAVENOUS | Status: DC

## 2020-12-17 MED ORDER — DEXAMETHASONE SODIUM PHOSPHATE 10 MG/ML IJ SOLN
INTRAMUSCULAR | Status: AC
  Filled 2020-12-17: qty 1

## 2020-12-17 MED ORDER — FENTANYL CITRATE (PF) 100 MCG/2ML IJ SOLN
INTRAMUSCULAR | Status: AC
  Filled 2020-12-17: qty 2

## 2020-12-17 SURGICAL SUPPLY — 20 items
CATH ROBINSON RED A/P 16FR (CATHETERS) ×3 IMPLANT
DEVICE MYOSURE LITE (MISCELLANEOUS) ×2 IMPLANT
DEVICE MYOSURE REACH (MISCELLANEOUS) IMPLANT
DILATOR CANAL MILEX (MISCELLANEOUS) IMPLANT
ELECT REM PT RETURN 9FT ADLT (ELECTROSURGICAL)
ELECTRODE REM PT RTRN 9FT ADLT (ELECTROSURGICAL) IMPLANT
GAUZE 4X4 16PLY ~~LOC~~+RFID DBL (SPONGE) ×6 IMPLANT
GLOVE SURG ENC MOIS LTX SZ6.5 (GLOVE) ×3 IMPLANT
GLOVE SURG UNDER POLY LF SZ7 (GLOVE) ×6 IMPLANT
GOWN STRL REUS W/TWL LRG LVL3 (GOWN DISPOSABLE) ×6 IMPLANT
IV NS IRRIG 3000ML ARTHROMATIC (IV SOLUTION) ×3 IMPLANT
KIT PROCEDURE FLUENT (KITS) ×3 IMPLANT
KIT TURNOVER CYSTO (KITS) ×3 IMPLANT
MYOSURE XL FIBROID (MISCELLANEOUS)
PACK VAGINAL MINOR WOMEN LF (CUSTOM PROCEDURE TRAY) ×3 IMPLANT
PAD OB MATERNITY 4.3X12.25 (PERSONAL CARE ITEMS) ×3 IMPLANT
PAD PREP 24X48 CUFFED NSTRL (MISCELLANEOUS) ×3 IMPLANT
SEAL CERVICAL OMNI LOK (ABLATOR) IMPLANT
SEAL ROD LENS SCOPE MYOSURE (ABLATOR) ×3 IMPLANT
SYSTEM TISS REMOVAL MYOSURE XL (MISCELLANEOUS) IMPLANT

## 2020-12-17 NOTE — Anesthesia Preprocedure Evaluation (Addendum)
Anesthesia Evaluation  Patient identified by MRN, date of birth, ID band Patient awake    Reviewed: Allergy & Precautions, H&P , NPO status , Patient's Chart, lab work & pertinent test results  Airway Mallampati: II  TM Distance: >3 FB Neck ROM: Full    Dental no notable dental hx. (+) Teeth Intact, Dental Advisory Given   Pulmonary sleep apnea ,    Pulmonary exam normal breath sounds clear to auscultation       Cardiovascular hypertension, Pt. on medications  Rhythm:Regular Rate:Normal     Neuro/Psych Anxiety negative neurological ROS     GI/Hepatic negative GI ROS, Neg liver ROS,   Endo/Other  negative endocrine ROS  Renal/GU negative Renal ROS  negative genitourinary   Musculoskeletal  (+) Arthritis ,   Abdominal   Peds  Hematology negative hematology ROS (+)   Anesthesia Other Findings   Reproductive/Obstetrics negative OB ROS                            Anesthesia Physical Anesthesia Plan  ASA: 2  Anesthesia Plan: MAC   Post-op Pain Management: Toradol IV (intra-op) and Tylenol PO (pre-op)   Induction: Intravenous  PONV Risk Score and Plan: 3 and Ondansetron, Dexamethasone and Propofol infusion  Airway Management Planned: Natural Airway and Simple Face Mask  Additional Equipment:   Intra-op Plan:   Post-operative Plan:   Informed Consent: I have reviewed the patients History and Physical, chart, labs and discussed the procedure including the risks, benefits and alternatives for the proposed anesthesia with the patient or authorized representative who has indicated his/her understanding and acceptance.     Dental advisory given  Plan Discussed with: CRNA  Anesthesia Plan Comments:        Anesthesia Quick Evaluation

## 2020-12-17 NOTE — Anesthesia Postprocedure Evaluation (Signed)
Anesthesia Post Note  Patient: [name]  Procedure(s) Performed: Victor (Vagina )     Patient location during evaluation: PACU Anesthesia Type: MAC Level of consciousness: awake and alert Pain management: pain level controlled Vital Signs Assessment: post-procedure vital signs reviewed and stable Respiratory status: spontaneous breathing, nonlabored ventilation and respiratory function stable Cardiovascular status: stable and blood pressure returned to baseline Postop Assessment: no apparent nausea or vomiting Anesthetic complications: no   No notable events documented.  Last Vitals:  Vitals:   12/17/20 0915 12/17/20 0930  BP: 113/78 101/71  Pulse: 68 64  Resp: 16 17  Temp:    SpO2: 99% 98%    Last Pain:  Vitals:   12/17/20 0945  TempSrc:   PainSc: 0-No pain                 Labrea Eccleston,W. EDMOND

## 2020-12-17 NOTE — Transfer of Care (Signed)
Immediate Anesthesia Transfer of Care Note  Patient: [name]  Procedure(s) Performed: DILATATION & CURETTAGE/HYSTEROSCOPY WITH MYOSURE (Vagina )  Patient Location: PACU  Anesthesia Type:MAC  Level of Consciousness: awake, alert  and oriented  Airway & Oxygen Therapy: Patient Spontanous Breathing and Patient connected to face mask oxygen  Post-op Assessment: Report given to RN and Post -op Vital signs reviewed and stable  Post vital signs: Reviewed and stable  Last Vitals:  Vitals Value Taken Time  BP 112/72 12/17/20 0904  Temp    Pulse 80 12/17/20 0905  Resp 13 12/17/20 0905  SpO2 100 % 12/17/20 0905  Vitals shown include unvalidated device data.  Last Pain:  Vitals:   12/17/20 0718  TempSrc: Oral  PainSc: 0-No pain      Patients Stated Pain Goal: 7 (09/81/19 1478)  Complications: No notable events documented.

## 2020-12-17 NOTE — Discharge Instructions (Signed)

## 2020-12-17 NOTE — Op Note (Signed)
Operative Note  12/17/2020  9:00 AM  PATIENT:  [name]  55 y.o. female  PRE-OPERATIVE DIAGNOSIS:  Breakthrough bleeding with endometrial polyp  POST-OPERATIVE DIAGNOSIS:  Breakthrough bleeding with endometrial polyp/thickened endometrium  PROCEDURE:  Procedure(s): DILATATION & CURETTAGE/HYSTEROSCOPY WITH MYOSURE  SURGEON:  Surgeon(s): [name], MD  ANESTHESIA:   paracervical block and MAC  FINDINGS: Right posterior endometrial thickening/polyp.  Ostia well seen.  DESCRIPTION OF OPERATION: Under MAC, the patient is in lithotomy position.  She is prepped with Betadine on the suprapubic, vulvar and vaginal areas.  Patient had passed urine just prior to entering the operating room.  Patient is draped as usual.  Timeout is done.  The vaginal exam reveals an anteverted uterus is large in size with nodularities corresponding to fibroids.  No adnexal mass felt.  The speculum is inserted in the vagina and the anterior lip of the cervix is grasped with a tenaculum.  A paracervical block is done with lidocaine 1% a total of 20 cc at 4 and 8:00.  Dilation of the cervix with Pratt dilators up to #19 without difficulty.  Insertion of the hysteroscope in the intra uterine cavity.  Pictures are taken showing both ostia, a thickened endometrium is focally present at the right posterior wall of the cavity.  This area could include small polyps.  The cavity is moderately enlarged due to fibroids but no submucosal fibroid visualized.  The light MyoSure is inserted through the hysteroscope.  Excision of the thickened endometrial area with possible polyps.  Pictures are taken after excision.  The hysteroscope with MyoSure are removed.  Curettage of the intra uterine cavity on all surfaces with a sharp curette.  Both specimens are sent together to pathology.  The curette is removed.  The tenaculum was removed from the cervix.  And the speculum is removed from the vagina.  Hemostasis was  adequate.  The patient is brought to recovery room in good and stable status.  ESTIMATED BLOOD LOSS: 5 mL Fluid deficit 270 mL  Intake/Output Summary (Last 24 hours) at 12/17/2020 0900 Last data filed at 12/17/2020 0859 Gross per 24 hour  Intake 700 ml  Output 5 ml  Net 695 ml     BLOOD ADMINISTERED:none   LOCAL MEDICATIONS USED:  LIDOCAINE   SPECIMEN:  Source of Specimen:  Myosure excision material thickened endometrium/polyp and endometrial curettings  DISPOSITION OF SPECIMEN:  PATHOLOGY  COUNTS:  YES  PLAN OF CARE: Transfer to PACU  Marie-Lyne LavoieMD9:00 AM

## 2020-12-17 NOTE — H&P (Signed)
[name] is an 55 y.o. female. G1P1L1   RP:  HSC/Myosure Excision of Polyp/D+C   HPI: No change x last visit 12/13/2020.  Perimenopausal Oligomenorrhea on the Progestin only pill, but developed very frequent BTB.  Endocervical Polyp present on Pelvic US.  H/O Fibroids IM and SS, no heavy menses or pain, observing. Had a Pelvic US for f/u Left Ovarian Cyst on 11/11/2020, stable with no pelvic pain, decision to observe.   Pertinent Gynecological History: Menses: flow is moderate with BTB Contraception: oral progesterone-only contraceptive Blood transfusions: none Sexually transmitted diseases: no past history Last mammogram: normal  Last pap: normal  OB History: G1, P1   Menstrual History: Patient's last menstrual period was 12/11/2020 (exact date).    Past Medical History:  Diagnosis Date   Allergic rhinitis, cause unspecified    dust, pollen, dogs   Allergy    BCC (basal cell carcinoma), face    x2.  Dr. Ubaldo Glassing   Deviated septum    Hypertension    Raynaud disease    Runner's knee    Sleep apnea    no CPAP   Superficial basal cell carcinoma 11/03/2020   left superior medial chest-Dr. Jamse [place]   Tennis elbow    right   Vitamin D deficiency 03/2010    Past Surgical History:  Procedure Laterality Date   BCC- Left face     x2 (left face)--Dr. Ubaldo Glassing   episiotomy      Family History  Problem Relation Age of Onset   Glaucoma Mother    Diabetes Mother    Thyroid disease Mother        s/p thyroid removal. Unclear if had hyperthyroid at 1 time   Basal cell carcinoma Father    Multiple sclerosis Father    Kidney Stones Father    Irritable bowel syndrome Brother    Kidney Stones Brother    Anxiety disorder Brother    Cancer Paternal Grandfather        melanoma   Melanoma Paternal Grandfather    Skin cancer Paternal Aunt        some aunts with basal cell CA   Melanoma Paternal Aunt    Ovarian cancer Maternal Aunt    Cancer Maternal Grandmother         brain tumor   Stroke Paternal Grandmother 83   Breast cancer Neg Hx    Colon cancer Neg Hx    Esophageal cancer Neg Hx    Rectal cancer Neg Hx    Stomach cancer Neg Hx    Colon polyps Neg Hx     Social History:  reports that she has never smoked. She has never used smokeless tobacco. She reports that she does not drink alcohol and does not use drugs.  Allergies: No Known Allergies  Medications Prior to Admission  Medication Sig Dispense Refill Last Dose   amLODipine (NORVASC) 2.5 MG tablet Take 1 tablet (2.5 mg total) by mouth daily. 90 tablet 3 12/16/2020   famotidine (PEPCID) 20 MG tablet Take 20 mg by mouth as needed for heartburn or indigestion.   Past Month   fexofenadine (ALLEGRA) 180 MG tablet Take 180 mg by mouth daily. Reported on 06/22/2015   12/16/2020   norethindrone (MICRONOR) 0.35 MG tablet Take 1 tablet (0.35 mg total) by mouth daily. 84 tablet 4 12/16/2020   VITAMIN D PO Take 2,000 Int'l Units by mouth.   12/16/2020    REVIEW OF SYSTEMS: A ROS was performed and pertinent  positives and negatives are included in the history.  GENERAL: No fevers or chills. HEENT: No change in vision, no earache, sore throat or sinus congestion. NECK: No pain or stiffness. CARDIOVASCULAR: No chest pain or pressure. No palpitations. PULMONARY: No shortness of breath, cough or wheeze. GASTROINTESTINAL: No abdominal pain, nausea, vomiting or diarrhea, melena or bright red blood per rectum. GENITOURINARY: No urinary frequency, urgency, hesitancy or dysuria. MUSCULOSKELETAL: No joint or muscle pain, no back pain, no recent trauma. DERMATOLOGIC: No rash, no itching, no lesions. ENDOCRINE: No polyuria, polydipsia, no heat or cold intolerance. No recent change in weight. HEMATOLOGICAL: No anemia or easy bruising or bleeding. NEUROLOGIC: No headache, seizures, numbness, tingling or weakness. PSYCHIATRIC: No depression, no loss of interest in normal activity or change in sleep pattern.     Blood  pressure 129/82, pulse 73, temperature 98.8 F (37.1 C), temperature source Oral, resp. rate 18, height 6' (1.829 m), weight 83 kg, last menstrual period 12/11/2020, SpO2 98 %.  Physical Exam:  See office notes   Results for orders placed or performed during the hospital encounter of 12/17/20 (from the past 24 hour(s))  Pregnancy, urine POC     Status: None   Collection Time: 12/17/20  6:49 AM  Result Value Ref Range   Preg Test, Ur NEGATIVE NEGATIVE   Pelvic US 11/11/2020: T/V images.  Comparison made with previous scan July 28, 2020.  Anteverted enlarged fibroid uterus measured at 12 x 10.13 x 16.38 cm.  Right lateral posterior subserosal fibroid measured at 7.9 x 8.9 cm with no significant change from previous scan.  Left lateral anterior subserosal fibroid noted measured at 4 x 5 cm with no significant change from previous scan.  Echogenic area noted in cervical canal measured at 1.9 x 0.5 cm with a feeder vessel, probable cervical polyp.  Thin symmetrical endometrium with no mass or thickening seen.  Right ovary is within normal limits.  Left ovary is enlarged with an avascular cystic area, with a solid component.  The cyst is measured at 2.4 x 3.8 cm which is slightly larger than on previous scan.  No free fluid in the pelvis.     Assessment/Plan:  55 y.o. G1P1    1. Endocervical polyp Ultrasound finding of an endocervical polyp reviewed with patient. Recommend excision.  HSC/Myosure Excision/D+C.  Preop preparation, surgery with risks and postop expectations and precautions reviewed.  Patient prefers Sedation with Paracervical Block, agree.   2. Intramural and subserous leiomyoma of uterus Stable asymptomatic fibroids.  Observing.   3. Left ovarian cyst Avascular left ovarian cyst with a small solid area with no significant change since July 2022.  Patient is asymptomatic. Ca125 Normal.  Pelvic ultrasound in 6 months.  Laparoscopic left oophorectomy discussed.  Patient voiced  understanding and agreement with the plan to observe at this time.                         Patient was counseled as to the risk of surgery to include the following:   1. Infection (prohylactic antibiotics will be administered)   2. DVT/Pulmonary Embolism (prophylactic pneumo compression stockings will be used)   3.Trauma to internal organs requiring additional surgical procedure to repair any injury to internal organs requiring perhaps additional hospitalization days.   4.Hemmorhage requiring transfusion and blood products which carry risks such as anaphylactic reaction, hepatitis and AIDS   Patient had received literature information on the procedure scheduled and all her questions were  answered and fully accepts all risk.   [name] 12/17/2020, 7:50 AM

## 2020-12-20 ENCOUNTER — Encounter (HOSPITAL_BASED_OUTPATIENT_CLINIC_OR_DEPARTMENT_OTHER): Payer: Self-pay | Admitting: Obstetrics & Gynecology

## 2020-12-21 LAB — SURGICAL PATHOLOGY

## 2021-01-05 ENCOUNTER — Ambulatory Visit (INDEPENDENT_AMBULATORY_CARE_PROVIDER_SITE_OTHER): Payer: 59 | Admitting: Obstetrics & Gynecology

## 2021-01-05 ENCOUNTER — Encounter: Payer: Self-pay | Admitting: Obstetrics & Gynecology

## 2021-01-05 ENCOUNTER — Other Ambulatory Visit: Payer: Self-pay

## 2021-01-05 VITALS — BP 120/78 | Resp 16

## 2021-01-05 DIAGNOSIS — Z3041 Encounter for surveillance of contraceptive pills: Secondary | ICD-10-CM

## 2021-01-05 DIAGNOSIS — Z09 Encounter for follow-up examination after completed treatment for conditions other than malignant neoplasm: Secondary | ICD-10-CM

## 2021-01-05 NOTE — Progress Notes (Signed)
° ° °  [place] 07/26/1965 [id number]        55 y.o.  G1P1   RP: Postop HSC/Myosure/D+C on 12/17/2020  HPI: Good postop evolution, but still having light brown spotting.  On the Progestin pill.  No pelvic pain.  No fever.  Urine/BMs normal.   OB History  Gravida Para Term Preterm AB Living  1 1       1   SAB IAB Ectopic Multiple Live Births               # Outcome Date GA Lbr Len/2nd Weight Sex Delivery Anes PTL Lv  1 Para             Past medical history,surgical history, problem list, medications, allergies, family history and social history were all reviewed and documented in the EPIC chart.   Directed ROS with pertinent positives and negatives documented in the history of present illness/assessment and plan.  Exam:  Vitals:   01/05/21 1636  BP: 120/78  Resp: 16   General appearance:  Normal  Abdomen: Normal  Gynecologic exam: Vulva normal.  Bimanual exam:  Uterus AV, mobile, NT.  No adnexal mass, NT.  Light brown blood present.  Patho: 12/17/2020: FINAL MICROSCOPIC DIAGNOSIS:   A. ENDOMETRIUM, CURETTAGE:  - Polypoid fragments of endometrium with progestin effect and focal  breakdown.  - Fragments suggestive of endocervical polyp.  - Unremarkable fragments of squamous epithelium and myometrium.  - Negative for atypia/EIN and malignancy.    Assessment/Plan:  56 y.o. G1P1   1. Status post gynecological surgery, follow-up exam Good postop healing.  No Cx. Patho benign. Still having brown spotting.  Decision to continue on the Progestin pill at least x 1 more pack.  If metrorrhagia persists after that, patient will contact me to reassess and consider alternative management options.  2. Encounter for surveillance of contraceptive pills Continue on the Progestin only BCPs.  F/U Annual Gyn exam when due.  Other orders - UNABLE TO FIND; Fluorouracil 5% + calcipotriene 0.005% (Patient not taking: Reported on 01/05/2021)   [name] MD, 5:03 PM  01/05/2021

## 2021-03-15 ENCOUNTER — Other Ambulatory Visit: Payer: Self-pay | Admitting: Obstetrics & Gynecology

## 2021-03-15 DIAGNOSIS — Z1231 Encounter for screening mammogram for malignant neoplasm of breast: Secondary | ICD-10-CM

## 2021-03-18 ENCOUNTER — Ambulatory Visit
Admission: RE | Admit: 2021-03-18 | Discharge: 2021-03-18 | Disposition: A | Discharge status: Home / Self Care | Payer: 59 | Source: Ambulatory Visit

## 2021-03-18 ENCOUNTER — Other Ambulatory Visit: Payer: Self-pay

## 2021-03-18 DIAGNOSIS — Z1231 Encounter for screening mammogram for malignant neoplasm of breast: Secondary | ICD-10-CM

## 2021-03-31 ENCOUNTER — Other Ambulatory Visit: Payer: Self-pay | Admitting: Family Medicine

## 2021-03-31 DIAGNOSIS — I10 Essential (primary) hypertension: Secondary | ICD-10-CM

## 2021-04-12 NOTE — Patient Instructions (Addendum)
?  HEALTH MAINTENANCE RECOMMENDATIONS: ? ?It is recommended that you get at least 30 minutes of aerobic exercise at least 5 days/week (for weight loss, you may need as much as 60-90 minutes). This can be any activity that gets your heart rate up. This can be divided in 10-15 minute intervals if needed, but try and build up your endurance at least once a week.  Weight bearing exercise is also recommended twice weekly. ? ?Eat a healthy diet with lots of vegetables, fruits and fiber.  "Colorful" foods have a lot of vitamins (ie green vegetables, tomatoes, red peppers, etc).  Limit sweet tea, regular sodas and alcoholic beverages, all of which has a lot of calories and sugar.  Up to 1 alcoholic drink daily may be beneficial for women (unless trying to lose weight, watch sugars).  Drink a lot of water. ? ?Calcium recommendations are 1200-1500 mg daily (1500 mg for postmenopausal women or women without ovaries), and vitamin D 1000 IU daily.  This should be obtained from diet and/or supplements (vitamins), and calcium should not be taken all at once, but in divided doses. ? ?Monthly self breast exams and yearly mammograms for women over the age of 64 is recommended. ? ?Sunscreen of at least SPF 30 should be used on all sun-exposed parts of the skin when outside between the hours of 10 am and 4 pm (not just when at beach or pool, but even with exercise, golf, tennis, and yard work!)  Use a sunscreen that says "broad spectrum" so it covers both UVA and UVB rays, and make sure to reapply every 1-2 hours. ? ?Remember to change the batteries in your smoke detectors when changing your clock times in the spring and fall. Carbon monoxide detectors are recommended for your home. ? ?Use your seat belt every time you are in a car, and please drive safely and not be distracted with cell phones and texting while driving. ? ?For your reflux--your choices are either taking pepcid '20mg'$  twice daily (before breakfast, and either before  dinner or at bedtime--see which works best for you), or Prilosec OTC (omeprazole) '20mg'$  once daily (30 mins before a meal--before breakfast) for 2-4 weeks (2 weeks is fine if your symptoms completely resolve). ?Then you can cut back--either to once daily pepcid for a bit, and eventually the goal is to use it prior to triggering meals, just as needed. ? ?If your throat-clearing and cough don't completely resolve with/during this treatment, postnasal drainage from allergies may be contributing. You can consider re-trying a nasal steroid spray. ? ?I recommend getting the new shingles vaccine (Shingrix). You may want to check with your insurance to verify what your out of pocket cost may be (usually covered as preventative, but better to verify to avoid any surprises, as this vaccine is expensive), and then schedule a nurse visit at our office when convenient (based on the possible side effects as discussed).   ?This is a series of 2 injections, spaced 2 months apart.  It doesn't have to be exactly 2 months apart (but can't be sooner), if that isn't feasible for your schedule, but try and get them close to 2 months (and definitely within 6 months of each other, or else the efficacy of the vaccine drops off). ?This should be separated from other vaccines by at least 2 weeks. ? ?

## 2021-04-12 NOTE — Progress Notes (Signed)
?Chief Complaint  ?Patient presents with  ? fasting cpe  ?  Fasting cpe, having acid reflux that's gotten worse, sees obgyn  ? ? ?[name] is a 56 y.o. female who presents for a complete physical.   ? ?She has the following concerns: ?  ?She would like her sugar checked.  She had a nonfasting sugar of 106 on her pre-op eval in 12/2020. ? ?Acid reflux has gotten worse since Jan/Feb.  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  Started flaring then, and coming/going since then. She only has occasional heartburn. She can feel some epigastric burning when it is at its worst.  She more commonly gets a full feeling in her throat--where it is hard to eat/drink due to the fullness. ?She has chronic throat-clearing, which has gotten worse since reflux is flaring.  She has a little more cough as well. ?She has cut back on her trigger foods, stopped having salads (vinegar-based dressing), tomatoes.  She was having symptoms within 30 minutes of eating mostly anything. ? ?She is using Pepcid just as needed, just once daily (if needed).  She no longer uses Tums (since kidney stone). Denies dysphagia. ? ?She had D&C, hysteroscopy with Myosure in 12/2020 with Dr. Dellis Filbert.  She had been having more breakthrough bleeding on progestin-only pill, and endocervical polyp was found. ?She has uterine fibroids. She was also noted to have L ovarian cyst on [place] in 07/2020.  Recheck in 11/022 was stable, Ca125 was normal.  Plan is for a 6 month f/u US on cyst.  She reports that her bleeding/spotting finally stopped in March. She did have some bleeding this month, but had taken a pill late.    ? ?Hypertension:  She is compliant with taking amlodipine 2.'5mg'$  daily.  ?BP's aren't checked very frequently, recalls they were all fine, and has been normal at other doctors. ?Monitor was verified as accurate in 12/2018.  ?She continues to walk regularly, and denies chest pain, shortness of  breath, palpitations. ?Denies any dizziness. ?BP Readings from Last 3 Encounters:  ?04/13/21 110/70  ?01/05/21 120/78  ?12/17/20 120/89  ? ? ?She has previously complained of fatigue.  Sleep study was done 05/2017 (due to snoring and unrefreshed seep).  Study showed mild OSA (AHI 7.9).  We had discussed treatment options, and recommended talking to her dentist about oral appliance. Dr. Toy Cookey wasn't on her insurance. Finally found someone to see to pursue the oral appliance, but then offices shut down due to COVID-19.  She talked about it with her dentist.  She decided to see ENT to see if there was anything else.  She saw Dr. Benjamine Mola, and was told she has deviated septum and enlarged turbinates, 95% blockage of nasal passage. He recommended septoplasty and turbinate reduction.  She hadn't pursued anything yet.  She continues to have some difficulty with breathing through her nose, fluctuates with her allergies, not too bothersome to her. ?She drifts off to sleep easily during the day only with reading, or with watching TV, but that is often later at night.  This fluctuates, not daily. Not falling asleep driving or other issues. She usually feels refreshed when she wakes up.  She denies any changes.  Fatigue/sleep is sometimes related to her work hours. ? ?Allergies:  dust, dog, ragweed.  She takes Human resources officer daily. She has constant nasal congestion.  Some sneezing in the morning. Tried Flonase x 2 months without improvement in nasal congestion.  She does note some PND currently. ? ?History of kidney stone:  She tries to stay well hydrated and hasn't had any further problems. It was a Calcium Oxalate stone. She stopped using Tums for heartburn, uses pepcid prn instead. ?  ?H/o Vitamin D deficiency:  Had a level of 15 in 02/2014; had gone up to 27 when taking 1000 IU daily. Last level was 33.1 in 06/2018, when taking 2000 IU daily.  She continues to take 2000 IU daily. ? ?Immunization History  ?Administered Date(s)  Administered  ? Influenza Split 09/02/2012  ? Influenza,inj,Quad PF,6+ Mos 10/02/2013, 09/09/2015, 09/21/2016, 09/26/2017, 08/15/2018, 08/29/2019, 10/06/2020  ? Influenza-Unspecified 11/03/2014  ? PFIZER Comirnaty(Gray Top)Covid-19 Tri-Sucrose Vaccine 04/07/2020  ? PFIZER(Purple Top)SARS-COV-2 Vaccination 03/12/2019, 04/02/2019  ? 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 Td 03/03/2015  ? ?Last Pap smear: 05/2020, normal, no high risk HPV ?Last mammogram: 03/2021 ?Last colonoscopy: 04/2020 Dr. AHavery Moros adenomatous polyps.  3 yr f/u recommended ?Last DEXA: never ?Dentist: recently (once/year since [place] ?Ophtho: recently. ?Exercise: walks 4-7 miles on the job, over an 8 hour day (per phone, doesn't indicate if there is any active minutes).  ?Not currently walking outside of work. ?Hasn't been doing weight-bearing exercise. ?She eats yogurt daily; she drinks some milk. ? ?Lipids: ?Lab Results  ?Component Value Date  ? CHOL 151 04/07/2020  ? HDL 52 04/07/2020  ? LC[place]89 04/07/2020  ? TRIG 46 04/07/2020  ? CHOLHDL 2.9 04/07/2020  ? ? ?PMH, PSH, SH and FH were reviewed and updated ?  ?Outpatient Encounter Medications as of 04/13/2021  ?Medication Sig  ? famotidine (PEPCID) 20 MG tablet Take 20 mg by mouth as needed for heartburn or indigestion.  ? fexofenadine (ALLEGRA) 180 MG tablet Take 180 mg by mouth daily. Reported on 06/22/2015  ? norethindrone (MICRONOR) 0.35 MG tablet Take 1 tablet (0.35 mg total) by mouth daily.  ? VITAMIN D PO Take 2,000 Int'l Units by mouth.  ? [DISCONTINUED] amLODipine (NORVASC) 2.5 MG tablet Take 1 tablet (2.5 mg total) by mouth daily.  ? amLODipine (NORVASC) 2.5 MG tablet Take 1 tablet (2.5 mg total) by mouth daily.  ? [DISCONTINUED] UNABLE TO FIND Fluorouracil 5% + calcipotriene 0.005% (Patient not taking: Reported on 01/05/2021)  ? ?No facility-administered encounter medications on file as of 04/13/2021.  ? ?No Known Allergies ? ?ROS: The patient denies anorexia,  fever, vision changes, decreased hearing, ear pain, sore throat, breast concerns, palpitations, dizziness, syncope, dyspnea on exertion, cough, swelling, nausea, vomiting, diarrhea, constipation, abdominal pain, melena, hematochezia, hematuria, incontinence, dysuria, vaginal discharge, odor or itch, genital lesions, numbness, tingling, weakness, tremor, suspicious skin lesions, depression, anxiety, abnormal bleeding/bruising, or enlarged lymph nodes.   ?Occasional bilateral knee pain, when walking up steep hills ?Raynaud's --mainly in winter, less frequent/mild.  Occurred recently with the cold rain. ?Chronic nasal congestion/allergies, unchanged. ?Throat-clearing and cough per HPI. ?No longer getting headaches (rare, related to clenching teeth).   ?Reflux flaring, per HPI ?Very rare stress incontinence with sneeze. ?Vaginal spotting/bleeding per HPI. ?She sees dermatologist regularly, had more BCC's removed (nose, chest) ? ? ?PHYSICAL EXAM: ? ?BP 110/70   Pulse 60   Ht '6\' 2"'$  (1.88 m)   Wt 173 lb 9.6 oz (78.7 kg)   BMI 22.29 kg/m?  ? ?Wt Readings from Last 3 Encounters:  ?04/13/21 173 lb 9.6 oz (78.7 kg)  ?12/17/20 182 lb 14.4 oz (83 kg)  ?12/14/20 180 lb (81.6 kg)  ? ? ?General Appearance:  Alert, cooperative, no distress, appears stated age     ?Head:    Normocephalic, without obvious abnormality, atraumatic     ?Eyes:    PERRL, conjunctiva/corneas clear, EOM's intact, fundi benign     ?Ears:    Cerumen partially obstructing views of TM's bilaterally, L>R. Visualized portions are normal  ?Nose:    Turbinates with mild congestion bilaterally, some septal deviation. Sinuses nontender  ?Throat:    Normal mucosa, no lesions  ?Neck:    Supple, no lymphadenopathy; thyroid: no enlargement/tenderness/nodules; no carotid bruit or JVD     ?Back:    Spine nontender, no curvature, ROM normal, no CVA tenderness     ?Lungs:    Clear to auscultation bilaterally without wheezes, rales or ronchi; respirations unlabored      ?Chest Wall:    No tenderness or deformity     ?Heart:    Regular rate and rhythm, S1 and S2 normal, no murmur, rub or gallop     ?Breast Exam:    Deferred to GYN     ?Abdomen:    Soft, non-tender, nondistended, normoac

## 2021-04-13 ENCOUNTER — Encounter: Payer: Self-pay | Admitting: Family Medicine

## 2021-04-13 ENCOUNTER — Ambulatory Visit (INDEPENDENT_AMBULATORY_CARE_PROVIDER_SITE_OTHER): Payer: 59 | Admitting: Family Medicine

## 2021-04-13 VITALS — BP 110/70 | HR 60 | Ht 74.0 in | Wt 173.6 lb

## 2021-04-13 DIAGNOSIS — D259 Leiomyoma of uterus, unspecified: Secondary | ICD-10-CM | POA: Diagnosis not present

## 2021-04-13 DIAGNOSIS — N83202 Unspecified ovarian cyst, left side: Secondary | ICD-10-CM | POA: Diagnosis not present

## 2021-04-13 DIAGNOSIS — J309 Allergic rhinitis, unspecified: Secondary | ICD-10-CM

## 2021-04-13 DIAGNOSIS — I10 Essential (primary) hypertension: Secondary | ICD-10-CM

## 2021-04-13 DIAGNOSIS — K219 Gastro-esophageal reflux disease without esophagitis: Secondary | ICD-10-CM

## 2021-04-13 DIAGNOSIS — E559 Vitamin D deficiency, unspecified: Secondary | ICD-10-CM

## 2021-04-13 DIAGNOSIS — Z0000 Encounter for general adult medical examination without abnormal findings: Secondary | ICD-10-CM | POA: Diagnosis not present

## 2021-04-13 DIAGNOSIS — G4733 Obstructive sleep apnea (adult) (pediatric): Secondary | ICD-10-CM

## 2021-04-13 MED ORDER — AMLODIPINE BESYLATE 2.5 MG PO TABS: 2.5000 mg | ORAL_TABLET | Freq: Every day | ORAL | 3 refills | Status: DC

## 2021-04-14 LAB — GLUCOSE, RANDOM: Glucose: 88 mg/dL (ref 70–99)

## 2021-04-14 LAB — VITAMIN D 25 HYDROXY (VIT D DEFICIENCY, FRACTURES): Vit D, 25-Hydroxy: 38.3 ng/mL (ref 30.0–100.0)

## 2021-05-19 ENCOUNTER — Ambulatory Visit: Payer: BC Managed Care – PPO | Admitting: Obstetrics & Gynecology

## 2021-07-20 ENCOUNTER — Encounter: Payer: Self-pay | Admitting: Obstetrics & Gynecology

## 2021-07-20 ENCOUNTER — Ambulatory Visit (INDEPENDENT_AMBULATORY_CARE_PROVIDER_SITE_OTHER): Payer: 59 | Admitting: Obstetrics & Gynecology

## 2021-07-20 VITALS — BP 120/76 | HR 76 | Ht 71.75 in | Wt 172.0 lb

## 2021-07-20 DIAGNOSIS — D251 Intramural leiomyoma of uterus: Secondary | ICD-10-CM | POA: Diagnosis not present

## 2021-07-20 DIAGNOSIS — Z01419 Encounter for gynecological examination (general) (routine) without abnormal findings: Secondary | ICD-10-CM | POA: Diagnosis not present

## 2021-07-20 DIAGNOSIS — D252 Subserosal leiomyoma of uterus: Secondary | ICD-10-CM | POA: Diagnosis not present

## 2021-07-20 DIAGNOSIS — Z3041 Encounter for surveillance of contraceptive pills: Secondary | ICD-10-CM | POA: Diagnosis not present

## 2021-07-20 MED ORDER — NORETHINDRONE 0.35 MG PO TABS: 1.0000 | ORAL_TABLET | Freq: Every day | ORAL | 4 refills | Status: DC

## 2021-07-20 NOTE — Progress Notes (Signed)
[place] Aug 08, 1965 [id number]   History:    56 y.o. G1P1L1 Married.  Son is 104 yo Administrator, arts in Secretary/administrator at [employer] in [employer]   RP:  Established patient presenting for annual gyn exam    HPI: Well on the Progestin-Pill.  Menses present, normal flow every 3-5 weeks. Known Uterine Fibroids.  HSC/D+C/Myosure 12/17/20 Patho benign/Polypoid endometrium and Endocervical Polyp.  No pelvic pain.  No pain with intercourse. No h/o abnormal Pap.  Pap Neg 05/2020.  Will repeat at 2-3 years.  Breasts normal.  Mammo 03/2021 Neg.  cHTN on Norvasc 2.5 mg daily, well controled.  Body mass index 23.49. Walking daily.  Health labs with family physician.  Colono 04/2020.   Past medical history,surgical history, family history and social history were all reviewed and documented in the EPIC chart.  Gynecologic History Patient's last menstrual period was 07/06/2021 (exact date).  Obstetric History OB History  Gravida Para Term Preterm AB Living  '1 1 1     1  '$ SAB IAB Ectopic Multiple Live Births               # Outcome Date GA Lbr Len/2nd Weight Sex Delivery Anes PTL Lv  1 Term              ROS: A ROS was performed and pertinent positives and negatives are included in the history.  GENERAL: No fevers or chills. HEENT: No change in vision, no earache, sore throat or sinus congestion. NECK: No pain or stiffness. CARDIOVASCULAR: No chest pain or pressure. No palpitations. PULMONARY: No shortness of breath, cough or wheeze. GASTROINTESTINAL: No abdominal pain, nausea, vomiting or diarrhea, melena or bright red blood per rectum. GENITOURINARY: No urinary frequency, urgency, hesitancy or dysuria. MUSCULOSKELETAL: No joint or muscle pain, no back pain, no recent trauma. DERMATOLOGIC: No rash, no itching, no lesions. ENDOCRINE: No polyuria, polydipsia, no heat or cold intolerance. No recent change in weight. HEMATOLOGICAL: No anemia or easy bruising or bleeding. NEUROLOGIC: No  headache, seizures, numbness, tingling or weakness. PSYCHIATRIC: No depression, no loss of interest in normal activity or change in sleep pattern.     Exam:   BP 120/76   Pulse 76   Ht 5' 11.75" (1.822 m)   Wt 172 lb (78 kg)   LMP 07/06/2021 (Exact Date) Comment: birth control pill  SpO2 98%   BMI 23.49 kg/m   Body mass index is 23.49 kg/m.  General appearance : Well developed well nourished female. No acute distress HEENT: Eyes: no retinal hemorrhage or exudates,  Neck supple, trachea midline, no carotid bruits, no thyroidmegaly Lungs: Clear to auscultation, no rhonchi or wheezes, or rib retractions  Heart: Regular rate and rhythm, no murmurs or gallops Breast:Examined in sitting and supine position were symmetrical in appearance, no palpable masses or tenderness,  no skin retraction, no nipple inversion, no nipple discharge, no skin discoloration, no axillary or supraclavicular lymphadenopathy Abdomen: no palpable masses or tenderness, no rebound or guarding Extremities: no edema or skin discoloration or tenderness  Pelvic: Vulva: Normal             Vagina: No gross lesions or discharge  Cervix: No gross lesions or discharge  Uterus  AV, stable nodular uterus about 12 cm, non-tender and mobile  Adnexa  Without masses or tenderness  Anus: Normal   Assessment/Plan:  56 y.o. female for annual exam   1. Well female exam with routine gynecological exam Well on the Progestin-Pill.  Menses present, normal flow every 3-5 weeks. Known Uterine Fibroids.  HSC/D+C/Myosure 12/17/20 Patho benign/Polypoid endometrium and Endocervical Polyp.  No pelvic pain.  No pain with intercourse. No h/o abnormal Pap.  Pap Neg 05/2020.  Will repeat at 2-3 years.  Breasts normal.  Mammo 03/2021 Neg.  cHTN on Norvasc 2.5 mg daily, well controled.  Body mass index 23.49. Walking daily.  Health labs with family physician.  [place]ono 04/2020.  2. Encounter for surveillance of contraceptive pills Well on the  Progestin only pill.  No CI to continue.  Prescription sent to pharmacy.  3. Intramural and subserous leiomyoma of uterus Stable and asymptomatic.  Other orders - norethindrone (MICRONOR) 0.35 MG tablet; Take 1 tablet (0.35 mg total) by mouth daily.   [name] MD, 4:12 PM 07/20/2021

## 2021-09-07 ENCOUNTER — Encounter: Payer: Self-pay | Admitting: Internal Medicine

## 2022-01-26 ENCOUNTER — Telehealth: Payer: Self-pay | Admitting: *Deleted

## 2022-01-26 DIAGNOSIS — N898 Other specified noninflammatory disorders of vagina: Secondary | ICD-10-CM

## 2022-01-26 DIAGNOSIS — N949 Unspecified condition associated with female genital organs and menstrual cycle: Secondary | ICD-10-CM

## 2022-01-26 NOTE — Telephone Encounter (Signed)
Pt left voice mail in triage requesting to be treated over phone. Symptoms include vaginal discharge. I left a message asking pt to return our call to obtain more information.

## 2022-01-27 MED ORDER — FLUCONAZOLE 150 MG PO TABS: 150.0000 mg | ORAL_TABLET | Freq: Once | ORAL | 0 refills | Status: AC

## 2022-01-27 NOTE — Telephone Encounter (Signed)
Pt LVM in triage line re: not receiving a response yet. Desires to know if provider will not write rx, what is the best recommended OTC option for her to use. Please advise.

## 2022-01-27 NOTE — Telephone Encounter (Signed)
Per ML: "Can send a Fluconazole.  If irritation, Hydroxycortisone 1% cream OTC"  Left detailed msg per DPR. Rx sent.

## 2022-01-27 NOTE — Telephone Encounter (Signed)
Pt reports initially noticed the discharge when using a kegel device but since then has developed irritation and some burning. Denies any odor/smell. Desires diflucan if possible.  UTD on AEX. Please advise.

## 2022-02-02 ENCOUNTER — Encounter: Payer: Self-pay | Admitting: Obstetrics & Gynecology

## 2022-02-03 NOTE — Telephone Encounter (Signed)
Please have patient schedule an appointment with Dr. Dellis Filbert for review of her treatment and planning for next steps.

## 2022-02-06 NOTE — Telephone Encounter (Signed)
FYI. Pt scheduled for 03/09/2022.

## 2022-02-13 ENCOUNTER — Other Ambulatory Visit: Payer: Self-pay | Admitting: Obstetrics & Gynecology

## 2022-02-13 DIAGNOSIS — Z1231 Encounter for screening mammogram for malignant neoplasm of breast: Secondary | ICD-10-CM

## 2022-02-17 ENCOUNTER — Other Ambulatory Visit (HOSPITAL_COMMUNITY)
Admission: RE | Admit: 2022-02-17 | Discharge: 2022-02-17 | Disposition: A | Discharge status: Home / Self Care | Payer: 59 | Source: Ambulatory Visit | Attending: Obstetrics & Gynecology | Admitting: Obstetrics & Gynecology

## 2022-02-17 ENCOUNTER — Ambulatory Visit (INDEPENDENT_AMBULATORY_CARE_PROVIDER_SITE_OTHER): Payer: 59 | Admitting: Obstetrics & Gynecology

## 2022-02-17 ENCOUNTER — Encounter: Payer: Self-pay | Admitting: Obstetrics & Gynecology

## 2022-02-17 VITALS — BP 116/72 | HR 76 | Resp 16

## 2022-02-17 DIAGNOSIS — N841 Polyp of cervix uteri: Secondary | ICD-10-CM | POA: Diagnosis not present

## 2022-02-17 DIAGNOSIS — N926 Irregular menstruation, unspecified: Secondary | ICD-10-CM | POA: Diagnosis present

## 2022-02-17 DIAGNOSIS — N83202 Unspecified ovarian cyst, left side: Secondary | ICD-10-CM

## 2022-02-17 DIAGNOSIS — D251 Intramural leiomyoma of uterus: Secondary | ICD-10-CM | POA: Diagnosis not present

## 2022-02-17 DIAGNOSIS — D252 Subserosal leiomyoma of uterus: Secondary | ICD-10-CM

## 2022-02-17 DIAGNOSIS — N898 Other specified noninflammatory disorders of vagina: Secondary | ICD-10-CM

## 2022-02-17 LAB — WET PREP FOR TRICH, YEAST, CLUE

## 2022-02-17 NOTE — Progress Notes (Signed)
    [place] 08-Sep-1965 [id number]        57 y.o.  G1P1001   RP: Recurrent yeast vaginitis/Irregular vaginal bleeding  HPI: Recurrent yeast vaginitis, not symptomatic currently.  Recent h/o irregular vaginal bleeding on the Progestin only pill.  No pelvic pain.  H/o Ovarian cyst/Endometrial Polyp and Fibroids.  Pelvic US scheduled for 03/09/22.   OB History  Gravida Para Term Preterm AB Living  1 1 1     1  $ SAB IAB Ectopic Multiple Live Births               # Outcome Date GA Lbr Len/2nd Weight Sex Delivery Anes PTL Lv  1 Term             Past medical history,surgical history, problem list, medications, allergies, family history and social history were all reviewed and documented in the EPIC chart.   Directed ROS with pertinent positives and negatives documented in the history of present illness/assessment and plan.  Exam:  Vitals:   02/17/22 1022  BP: 116/72  Pulse: 76  Resp: 16   General appearance:  Normal   Gynecologic exam: Vulva normal.  Speculum:  Cervix/vagina normal.  Normal secretions.  Wet prep/SureSwab Advanced done.   Informed written consent obtained for EBx:  Betadine prep.  Hurricane spray.  Easy insertion of the EBx pipette.  EBx by negative pressure on all endometrial surfaces.  Pipette removed.  Specimen adequate, sent to patho.  Speculum removed.  Well tolerated, no Cx.  Wet prep Neg   Assessment/Plan:  57 y.o. G1P1001   1. Vaginal discharge Wet prep Neg, reassured.  Pending SureSwab. - WET PREP FOR [place], YEAST, CLUE - SureSwab Advanced Vaginitis Plus,TMA  2. Irregular menses Endometrial Bx done today to r/o precancer and cancer of the endometrium.  Pending patho.  F/U Pelvic US to further investigate.  Post procedure precautions. - Surgical pathology( [facility]/ [facility])  3. Intramural and subserous leiomyoma of uterus F/U Pelvic US 03/09/22.  4. Endocervical polyp F/U Pelvic US 03/09/22.  5. Left ovarian cyst F/U Pelvic  US 03/09/22.  Other orders - Ascorbic Acid (VITAMIN C PO); Take by mouth. - MAGNESIUM PO; Take by mouth.   [name] MD, 10:39 AM 02/17/2022

## 2022-02-18 LAB — SURESWAB® ADVANCED VAGINITIS PLUS,TMA
C. trachomatis RNA, TMA: NOT DETECTED
CANDIDA SPECIES: NOT DETECTED
Candida glabrata: NOT DETECTED
N. gonorrhoeae RNA, TMA: NOT DETECTED
SURESWAB(R) ADV BACTERIAL VAGINOSIS(BV),TMA: NEGATIVE
TRICHOMONAS VAGINALIS (TV),TMA: NOT DETECTED

## 2022-02-20 NOTE — Addendum Note (Signed)
Addended by: [name] on: 02/20/2022 10:35 AM   Modules accepted: Orders

## 2022-02-21 LAB — SURGICAL PATHOLOGY

## 2022-03-09 ENCOUNTER — Encounter: Payer: Self-pay | Admitting: Obstetrics & Gynecology

## 2022-03-09 ENCOUNTER — Ambulatory Visit (INDEPENDENT_AMBULATORY_CARE_PROVIDER_SITE_OTHER): Payer: 59 | Admitting: Obstetrics & Gynecology

## 2022-03-09 VITALS — BP 118/64 | HR 75

## 2022-03-09 DIAGNOSIS — D252 Subserosal leiomyoma of uterus: Secondary | ICD-10-CM

## 2022-03-09 DIAGNOSIS — N921 Excessive and frequent menstruation with irregular cycle: Secondary | ICD-10-CM | POA: Diagnosis not present

## 2022-03-09 DIAGNOSIS — D251 Intramural leiomyoma of uterus: Secondary | ICD-10-CM | POA: Diagnosis not present

## 2022-03-09 DIAGNOSIS — N83202 Unspecified ovarian cyst, left side: Secondary | ICD-10-CM

## 2022-03-09 NOTE — Progress Notes (Signed)
    [name] [birth year] [id number]        57 y.o.  G1P1001 Married  RP: Counseling and management of irregular bleeding  HPI: Irregular bleeding on the Progestin only pill.  LMP 01/31/22.  Pt c/o bright red bleeding during cycle with clotting at times.  Had bleeding on 02/17/22 after the EBx, the bleeding lasted 5 days after the procedure.  Patho: Inactive endometrium, negative for atypia, hyperplasia or malignancy. No pelvic pain. No hot flushes.  Sexually active.  Known fibroids.  H/O Lt ovarian cyst.  Pelvic US scheduled 03/30/22.    OB History  Gravida Para Term Preterm AB Living  '1 1 1     1  '$ SAB IAB Ectopic Multiple Live Births               # Outcome Date GA Lbr Len/2nd Weight Sex Delivery Anes PTL Lv  1 Term             Past medical history,surgical history, problem list, medications, allergies, family history and social history were all reviewed and documented in the EPIC chart.   Directed ROS with pertinent positives and negatives documented in the history of present illness/assessment and plan.  Exam:  Vitals:   03/09/22 1008  BP: 118/64  Pulse: 75  SpO2: 97%   General appearance:  Normal  Abdomen: Normal  Gynecologic exam: Vulva normal.  Bimanual exam:  Uterus nodular, increased in size at about 10-12 cm, mobile, NT.  No adnexal mass, NT.   Assessment/Plan:  57 y.o. G1P1001   1. Menometrorrhagia  Irregular bleeding on the Progestin only pill.  LMP 01/31/22.  Pt c/o bright red bleeding during cycle with clotting at times.  Had bleeding on 02/17/22 after the EBx, the bleeding lasted 5 days after the procedure.  Patho: Inactive endometrium, negative for atypia, hyperplasia or malignancy. No pelvic pain. No hot flushes.  Sexually active.  Known fibroids.  H/O Lt ovarian cyst.  Pelvic US scheduled 03/30/22.   Irregular menses/menometro probably d/t perimenopause and uterine fibroids.  EBx Benign 02/17/22.  No bleeding x the last 2 weeks.  Continue on the  Progestin pill until Pelvic US 03/30/22.  CBC, TSH, FSH today.  Declines the Progesterone IUD or DepoProvera.  Husband allergic to latex condoms. - CBC - TSH - FSH  2. Intramural and subserous leiomyoma of uterus Will reassess by Pelvic US 03/30/22.  3. Left ovarian cyst  Pelvic US 03/30/22.  [name] MD, 10:24 AM 03/09/2022

## 2022-03-10 LAB — CBC
HCT: 39.7 % (ref 35.0–45.0)
Hemoglobin: 13.4 g/dL (ref 11.7–15.5)
MCH: 29.6 pg (ref 27.0–33.0)
MCHC: 33.8 g/dL (ref 32.0–36.0)
MCV: 87.8 fL (ref 80.0–100.0)
MPV: 9.7 fL (ref 7.5–12.5)
Platelets: 292 10*3/uL (ref 140–400)
RBC: 4.52 10*6/uL (ref 3.80–5.10)
RDW: 11.8 % (ref 11.0–15.0)
WBC: 6.3 10*3/uL (ref 3.8–10.8)

## 2022-03-10 LAB — TSH: TSH: 1.37 m[IU]/L

## 2022-03-10 LAB — FOLLICLE STIMULATING HORMONE: FSH: 95.1 m[IU]/mL

## 2022-03-30 ENCOUNTER — Encounter: Payer: Self-pay | Admitting: Obstetrics & Gynecology

## 2022-03-30 ENCOUNTER — Other Ambulatory Visit: Payer: Self-pay | Admitting: Obstetrics & Gynecology

## 2022-03-30 ENCOUNTER — Ambulatory Visit (INDEPENDENT_AMBULATORY_CARE_PROVIDER_SITE_OTHER): Payer: 59 | Admitting: Obstetrics & Gynecology

## 2022-03-30 ENCOUNTER — Ambulatory Visit (INDEPENDENT_AMBULATORY_CARE_PROVIDER_SITE_OTHER): Payer: 59

## 2022-03-30 VITALS — BP 118/84 | HR 70 | Wt 188.0 lb

## 2022-03-30 DIAGNOSIS — D251 Intramural leiomyoma of uterus: Secondary | ICD-10-CM | POA: Diagnosis not present

## 2022-03-30 DIAGNOSIS — N951 Menopausal and female climacteric states: Secondary | ICD-10-CM | POA: Diagnosis not present

## 2022-03-30 DIAGNOSIS — N841 Polyp of cervix uteri: Secondary | ICD-10-CM | POA: Diagnosis not present

## 2022-03-30 DIAGNOSIS — D252 Subserosal leiomyoma of uterus: Secondary | ICD-10-CM

## 2022-03-30 DIAGNOSIS — N83202 Unspecified ovarian cyst, left side: Secondary | ICD-10-CM

## 2022-03-30 NOTE — Progress Notes (Signed)
    [name] November 22, 1965 [id number]        56 y.o.  G1P1L1   RP: F/U Left Ovarian Cyst  HPI: Left Ovarian cyst on Pelvic US in 11/2020.  No pelvic pain currently.  Perimenopausal with [place] at 95.1 on 03/09/22.  LMP 01/31/2022.  EBx 02/17/22 Benign.    FINAL MICROSCOPIC DIAGNOSIS:   A. ENDOMETRIUM, BIOPSY on 02/17/22 Inactive endometrium.  Negative for atypia, hyperplasia or malignancy.   OB History  Gravida Para Term Preterm AB Living  1 1 1     1   SAB IAB Ectopic Multiple Live Births               # Outcome Date GA Lbr Len/2nd Weight Sex Delivery Anes PTL Lv  1 Term             Past medical history,surgical history, problem list, medications, allergies, family history and social history were all reviewed and documented in the EPIC chart.   Directed ROS with pertinent positives and negatives documented in the history of present illness/assessment and plan.  Exam:  Vitals:   03/30/22 0944  BP: 118/84  Pulse: 70  Weight: 188 lb (85.3 kg)   General appearance:  Normal  Pelvic US today: Comparison is made with previous scan in November 2022.  Both transabdominal and transvaginal techniques were necessary to evaluate the anatomy.  Anteverted, enlarged, irregular uterus with large subserosal fibroids.  No change in size of the fibroids measured previously.  The uterus is measured at 13.14 x 12.23 x 14.32 cm.  The endometrial lining is thin and symmetrical measured at 4.32 mm.  No mass or abnormal flow is seen at the endometrium.  The cervical canal is now normal.  Both ovaries are normal in size.  The right ovary shows no visible follicles.  The left ovary shows a single simple follicle measured at 1.6 cm.  The previously seen left ovarian cyst is no longer present.  No free fluid in the pelvis.   Assessment/Plan:  57 y.o. G1P1001   1. Left ovarian cyst Left Ovarian cyst on Pelvic US in 11/2020.  No pelvic pain currently.  Pelvic US findings thoroughly reviewed.   Patient reassured that the Left Ovarian cyst is no longer present. No FF.  2. Perimenopause  Perimenopausal with FSH at 95.1 on 03/09/22.  LMP 01/31/2022.  EBx 02/17/22 Benign. Thin normal endometrial line on Pelvic US today at 4.32 mm.  Perimenopausal bleeding precautions reviewed.  Will observe.  [name] MD, 9:54 AM 03/30/2022

## 2022-04-15 ENCOUNTER — Other Ambulatory Visit: Payer: Self-pay | Admitting: Family Medicine

## 2022-04-15 DIAGNOSIS — I10 Essential (primary) hypertension: Secondary | ICD-10-CM

## 2022-04-18 NOTE — Progress Notes (Unsigned)
No chief complaint on file.   [name] is a 57 y.o. female who presents for a complete physical.    She has the following concerns:  She is under the care of Dr. Seymour Bars, and recently had benign EMB and pelvic ultrasound (large subserosal fibroids, stable).  She had been having more BTB on progestin-only pill.  The L ovarian cyst from 2022 had resolved.  FSH was elevated at 95.1. She had normal TSH and CBC.  GERD: doing well, using Pepcid once daily as needed. (Had flared Jan-Feb 2023 when working longer hours, eating different foods).  UPDATE  Hypertension:  She is compliant with taking amlodipine 2.5mg  daily.  BP's aren't checked very frequently, recalls they were all fine, and has been normal at other doctors. Monitor was verified as accurate in 12/2018.  She continues to walk regularly, and denies chest pain, shortness of breath, palpitations. Denies any dizziness. BP Readings from Last 3 Encounters:  03/30/22 118/84  03/09/22 118/64  02/17/22 116/72   Mild OSA: She has previously complained of fatigue.  Sleep study was done 05/2017 (due to snoring and unrefreshed seep).  Study showed mild OSA (AHI 7.9).  We had discussed treatment options, and recommended talking to her dentist about oral appliance. Dr. Toni Arthurs wasn't on her insurance. Finally found someone to see to pursue the oral appliance, but then offices shut down due to COVID-19.  She talked about it with her dentist.  She decided to see ENT to see if there was anything else.  She saw Dr. Suszanne Conners, and was told she has deviated septum and enlarged turbinates, 95% blockage of nasal passage. He recommended septoplasty and turbinate reduction.  She hadn't pursued anything yet.  She continues to have some difficulty with breathing through her nose, fluctuates with her allergies, not too bothersome to her. She drifts off to sleep easily during the day only with reading, or with watching TV, but that is often later at  night.  This fluctuates, not daily. Not falling asleep driving or other issues. She usually feels refreshed when she wakes up.  She denies any changes.  Fatigue/sleep is sometimes related to her work hours.  Allergies:  dust, dog, ragweed.  She takes Careers adviser daily. She has constant nasal congestion.  Some sneezing in the morning. Tried Flonase x 2 months without improvement in nasal congestion.  She does note some PND currently.  History of kidney stone:  She tries to stay well hydrated and hasn't had any further problems. It was a Calcium Oxalate stone. She stopped using Tums for heartburn, uses pepcid prn instead.   H/o Vitamin D deficiency:  Had a level of 15 in 02/2014; had gone up to 27 when taking 1000 IU daily. Last level was 38.3 in 04/2021 when taking 2000 IU daily.  She continues to take 2000 IU daily.   Immunization History  Administered Date(s) Administered   Influenza Split 09/02/2012   Influenza,inj,Quad PF,6+ Mos 10/02/2013, 09/09/2015, 09/21/2016, 09/26/2017, 08/15/2018, 08/29/2019, 10/06/2020   Influenza-Unspecified 11/03/2014   PFIZER Comirnaty(Gray Top)Covid-19 Tri-Sucrose Vaccine 04/07/2020   PFIZER(Purple Top)SARS-COV-2 Vaccination 03/12/2019, 04/02/2019   Pfizer Covid-19 Vaccine Bivalent Booster 72yrs & up 10/06/2020   Td 03/03/2015   Last Pap smear: 05/2020, normal, no high risk HPV Last mammogram: 03/2021, scheduled for later this week Last colonoscopy: 04/2020 Dr. Adela Lank, adenomatous polyps.  3 yr f/u recommended Last DEXA: never Dentist: recently (once/year since COVID) Ophtho: recently. Exercise: walks 4-7 miles on the job, over an 8 hour  day (per phone, doesn't indicate if there is any active minutes).  Not currently walking outside of work. Hasn't been doing weight-bearing exercise. She eats yogurt daily; she drinks some milk.  Lipids: Lab Results  Component Value Date   CHOL 151 04/07/2020   HDL 52 04/07/2020   LDLCALC 89 04/07/2020   TRIG 46  04/07/2020   CHOLHDL 2.9 04/07/2020    PMH, PSH, SH and FH were reviewed and updated     ROS: The patient denies anorexia, fever, vision changes, decreased hearing, ear pain, sore throat, breast concerns, palpitations, dizziness, syncope, dyspnea on exertion, cough, swelling, nausea, vomiting, diarrhea, constipation, abdominal pain, melena, hematochezia, hematuria, incontinence, dysuria, vaginal discharge, odor or itch, genital lesions, numbness, tingling, weakness, tremor, suspicious skin lesions, depression, anxiety, abnormal bleeding/bruising, or enlarged lymph nodes.   Occasional bilateral knee pain, when walking up steep hills Raynaud's --mainly in winter, less frequent/mild.   Chronic nasal congestion/allergies, with intermittent throat-clearing and cough No longer getting headaches (rare, related to clenching teeth).   Reflux infrequent, per HPI Very rare stress incontinence with sneeze. Irregular vaginal bleeding per HPI Hot flashes? Night sweats? She sees dermatologist regularly   PHYSICAL EXAM:  LMP 02/17/2022 (Exact Date) Comment: micronor-sexually active  Wt Readings from Last 3 Encounters:  03/30/22 188 lb (85.3 kg)  07/20/21 172 lb (78 kg)  04/13/21 173 lb 9.6 oz (78.7 kg)    General Appearance:    Alert, cooperative, no distress, appears stated age     Head:    Normocephalic, without obvious abnormality, atraumatic     Eyes:    PERRL, conjunctiva/corneas clear, EOM's intact, fundi benign     Ears:    Cerumen partially obstructing views of TM's bilaterally, L>R. Visualized portions are normal  Nose:    Turbinates with mild congestion bilaterally, some septal deviation. Sinuses nontender  Throat:    Normal mucosa, no lesions  Neck:    Supple, no lymphadenopathy; thyroid: no enlargement/tenderness/nodules; no carotid bruit or JVD     Back:    Spine nontender, no curvature, ROM normal, no CVA tenderness     Lungs:    Clear to auscultation bilaterally without wheezes,  rales or ronchi; respirations unlabored     Chest Wall:    No tenderness or deformity     Heart:    Regular rate and rhythm, S1 and S2 normal, no murmur, rub or gallop     Breast Exam:    Deferred to GYN     Abdomen:    Soft, non-tender, nondistended, normoactive bowel sounds, no masses, no hepatosplenomegaly     Genitalia:    Deferred to GYN            Extremities:    No clubbing, cyanosis or edema     Pulses:    2+ and symmetric all extremities     Skin:    Skin color, texture, turgor normal, no lesions.   Lymph nodes:    Cervical, supraclavicular nodes normal     Neurologic:    Cranial nerves grossly intact. Normal strength, sensation and gait; reflexes 2+ and symmetric throughout. Alert and oriented                    Psych:    Normal mood, affect, hygiene and grooming   ***UPDATE--cerumen or clear TM's?  Congestion?  ASSESSMENT/PLAN:  Did she get flu shot? COVID booster?  Shingrix recommended  C-met RF amlodipine  Discussed monthly self breast exams and yearly mammograms;  at least 30 minutes of aerobic activity at least 5 days/week, weight-bearing exercise at least 2x/wk; proper sunscreen use reviewed; healthy diet, including goals of calcium and vitamin D intake and alcohol recommendations (less than or equal to 1 drink/day) reviewed; regular seatbelt use; changing batteries in smoke detectors.  Immunization recommendations discussed-- Continue yearly flu shots.  Shingrix recommended--side effects reviewed. Colon cancer screening--UTD, due again 04/2023.   F/u 1 year, sooner prn.

## 2022-04-18 NOTE — Patient Instructions (Incomplete)
  HEALTH MAINTENANCE RECOMMENDATIONS:  It is recommended that you get at least 30 minutes of aerobic exercise at least 5 days/week (for weight loss, you may need as much as 60-90 minutes). This can be any activity that gets your heart rate up. This can be divided in 10-15 minute intervals if needed, but try and build up your endurance at least once a week.  Weight bearing exercise is also recommended twice weekly.  Eat a healthy diet with lots of vegetables, fruits and fiber.  "Colorful" foods have a lot of vitamins (ie green vegetables, tomatoes, red peppers, etc).  Limit sweet tea, regular sodas and alcoholic beverages, all of which has a lot of calories and sugar.  Up to 1 alcoholic drink daily may be beneficial for women (unless trying to lose weight, watch sugars).  Drink a lot of water.  Calcium recommendations are 1200-1500 mg daily (1500 mg for postmenopausal women or women without ovaries), and vitamin D 1000 IU daily.  This should be obtained from diet and/or supplements (vitamins), and calcium should not be taken all at once, but in divided doses.  Monthly self breast exams and yearly mammograms for women over the age of 33 is recommended.  Sunscreen of at least SPF 30 should be used on all sun-exposed parts of the skin when outside between the hours of 10 am and 4 pm (not just when at beach or pool, but even with exercise, golf, tennis, and yard work!)  Use a sunscreen that says "broad spectrum" so it covers both UVA and UVB rays, and make sure to reapply every 1-2 hours.  Remember to change the batteries in your smoke detectors when changing your clock times in the spring and fall. Carbon monoxide detectors are recommended for your home.  Use your seat belt every time you are in a car, and please drive safely and not be distracted with cell phones and texting while driving.  Consider using the Pepcid AC PRIOR to eating the triggering foods, to prevent the reflux symptoms.  Versus getting  a faster-acting medication (Pepcid complete, vs Mylanta/Maalox) to use once you have symptoms.  Feel free to change up your antihistamine, or try a different nasal steroid spray (nasacort, etc), remembering to use just gentle sniffs, if your allergies aren't well controlled.  I recommend getting the new shingles vaccine (Shingrix). Schedule a nurse visit at our office when convenient (based on the possible side effects as discussed).   This is a series of 2 injections, spaced 2 months apart.  It doesn't have to be exactly 2 months apart (but can't be sooner), if that isn't feasible for your schedule, but try and get them close to 2 months (and definitely within 6 months of each other, or else the efficacy of the vaccine drops off). This should be separated from other vaccines by at least 2 weeks.

## 2022-04-20 ENCOUNTER — Ambulatory Visit
Admission: RE | Admit: 2022-04-20 | Discharge: 2022-04-20 | Disposition: A | Discharge status: Home / Self Care | Payer: 59 | Source: Ambulatory Visit

## 2022-04-20 ENCOUNTER — Ambulatory Visit (INDEPENDENT_AMBULATORY_CARE_PROVIDER_SITE_OTHER): Payer: 59 | Admitting: Family Medicine

## 2022-04-20 ENCOUNTER — Encounter: Payer: Self-pay | Admitting: Family Medicine

## 2022-04-20 VITALS — BP 130/84 | HR 80 | Ht 72.0 in | Wt 184.4 lb

## 2022-04-20 DIAGNOSIS — Z1231 Encounter for screening mammogram for malignant neoplasm of breast: Secondary | ICD-10-CM

## 2022-04-20 DIAGNOSIS — N951 Menopausal and female climacteric states: Secondary | ICD-10-CM

## 2022-04-20 DIAGNOSIS — J309 Allergic rhinitis, unspecified: Secondary | ICD-10-CM

## 2022-04-20 DIAGNOSIS — Z0000 Encounter for general adult medical examination without abnormal findings: Secondary | ICD-10-CM | POA: Diagnosis not present

## 2022-04-20 DIAGNOSIS — G4733 Obstructive sleep apnea (adult) (pediatric): Secondary | ICD-10-CM

## 2022-04-20 DIAGNOSIS — I10 Essential (primary) hypertension: Secondary | ICD-10-CM

## 2022-04-20 DIAGNOSIS — E559 Vitamin D deficiency, unspecified: Secondary | ICD-10-CM | POA: Diagnosis not present

## 2022-04-20 DIAGNOSIS — Z5181 Encounter for therapeutic drug level monitoring: Secondary | ICD-10-CM

## 2022-04-20 DIAGNOSIS — K219 Gastro-esophageal reflux disease without esophagitis: Secondary | ICD-10-CM

## 2022-04-20 DIAGNOSIS — D259 Leiomyoma of uterus, unspecified: Secondary | ICD-10-CM

## 2022-04-20 LAB — POCT URINALYSIS DIP (PROADVANTAGE DEVICE)
Bilirubin, UA: NEGATIVE
Blood, UA: NEGATIVE
Glucose, UA: NEGATIVE mg/dL
Ketones, POC UA: NEGATIVE mg/dL
Nitrite, UA: NEGATIVE
Protein Ur, POC: NEGATIVE mg/dL
Specific Gravity, Urine: 1.01
Urobilinogen, Ur: 0.2
pH, UA: 6 (ref 5.0–8.0)

## 2022-04-20 MED ORDER — AMLODIPINE BESYLATE 2.5 MG PO TABS: 2.5000 mg | ORAL_TABLET | Freq: Every day | ORAL | 3 refills | Status: DC

## 2022-04-21 ENCOUNTER — Telehealth: Payer: Self-pay

## 2022-04-21 LAB — COMPREHENSIVE METABOLIC PANEL
ALT: 18 IU/L (ref 0–32)
AST: 21 IU/L (ref 0–40)
Albumin/Globulin Ratio: 2.6 — ABNORMAL HIGH (ref 1.2–2.2)
Albumin: 4.9 g/dL (ref 3.8–4.9)
Alkaline Phosphatase: 81 IU/L (ref 44–121)
BUN/Creatinine Ratio: 13 (ref 9–23)
BUN: 9 mg/dL (ref 6–24)
Bilirubin Total: 0.5 mg/dL (ref 0.0–1.2)
CO2: 23 mmol/L (ref 20–29)
Calcium: 9.3 mg/dL (ref 8.7–10.2)
Chloride: 103 mmol/L (ref 96–106)
Creatinine, Ser: 0.72 mg/dL (ref 0.57–1.00)
Globulin, Total: 1.9 g/dL (ref 1.5–4.5)
Glucose: 92 mg/dL (ref 70–99)
Potassium: 4.4 mmol/L (ref 3.5–5.2)
Sodium: 141 mmol/L (ref 134–144)
Total Protein: 6.8 g/dL (ref 6.0–8.5)
eGFR: 99 mL/min/{1.73_m2} (ref >59)

## 2022-04-21 LAB — MAGNESIUM: Magnesium: 2.2 mg/dL (ref 1.6–2.3)

## 2022-04-21 LAB — LIPID PANEL
Chol/HDL Ratio: 3.2 ratio (ref 0.0–4.4)
Cholesterol, Total: 167 mg/dL (ref 100–199)
HDL: 52 mg/dL (ref >39)
LDL Chol Calc (NIH): 99 mg/dL (ref 0–99)
Triglycerides: 87 mg/dL (ref 0–149)
VLDL Cholesterol Cal: 16 mg/dL (ref 5–40)

## 2022-04-21 NOTE — Telephone Encounter (Signed)
You can advise the patient that I put comments on her results in MyChart.  Nothing to be concerned about!

## 2022-04-21 NOTE — Telephone Encounter (Signed)
Pt called to inquire if she would need a follow up regarding labs. She is aware that you have not had a chance to review them to leave any notes. She is planning to go out of town so she wanted to ask if there's anything that you see as urgent regarding her lab results that she may need to come in for?

## 2022-04-21 NOTE — Telephone Encounter (Signed)
Pt.notified

## 2022-05-04 ENCOUNTER — Other Ambulatory Visit (INDEPENDENT_AMBULATORY_CARE_PROVIDER_SITE_OTHER): Payer: 59

## 2022-05-04 DIAGNOSIS — Z23 Encounter for immunization: Secondary | ICD-10-CM

## 2022-07-25 NOTE — Progress Notes (Unsigned)
No chief complaint on file.   Last discussed at her physical in 04/2022: GERD: doing well, using Pepcid once daily as needed. Tomato sauces trigger her reflux. She tends to use it AFTER eating the food, rather than prior. Denies dysphagia. (Had flared Jan-Feb 2023 when working longer hours, eating different foods, used pepcid BID x 2 wks, resolved).  She was advised: Consider using the Pepcid AC PRIOR to eating the triggering foods, to prevent the reflux symptoms. Versus getting a faster-acting medication (Pepcid complete, vs Mylanta/Maalox) to use once you have symptoms.    PMH, PSH, SH reviewed   ROS:   PHYSICAL EXAM:  There were no vitals taken for this visit.  Wt Readings from Last 3 Encounters:  04/20/22 184 lb 6.4 oz (83.6 kg)  03/30/22 188 lb (85.3 kg)  07/20/21 172 lb (78 kg)      ASSESSMENT/PLAN:   Does she want 2nd shingrix now, vs returning when scheduled on 8/8?  F/u as scheduled for CPE 04/2023

## 2022-07-26 ENCOUNTER — Ambulatory Visit: Payer: 59 | Admitting: Family Medicine

## 2022-07-26 ENCOUNTER — Encounter: Payer: Self-pay | Admitting: Family Medicine

## 2022-07-26 VITALS — BP 122/76 | HR 68 | Temp 97.2°F | Ht 72.0 in | Wt 173.6 lb

## 2022-07-26 DIAGNOSIS — Z23 Encounter for immunization: Secondary | ICD-10-CM

## 2022-07-26 DIAGNOSIS — K219 Gastro-esophageal reflux disease without esophagitis: Secondary | ICD-10-CM | POA: Diagnosis not present

## 2022-07-26 DIAGNOSIS — J309 Allergic rhinitis, unspecified: Secondary | ICD-10-CM

## 2022-07-26 NOTE — Patient Instructions (Signed)
Continue the twice daily pepcid.  If your symptoms do not completely resolve on this regimen, switch to omeprazole (prilosec OTC) or Nexium 24 hour. Take these for 2 weeks (you can double up to the prescription dose of 2 pills daily if one isn't enough). Take these just for 2 weeks, and then go back to pepcid--initially twice daily, and hopefull you can get back to just the once daily pepcid as you had been taking before.  If you develop persistent/worsening symptoms, or trouble swallowing, let us know.

## 2022-08-10 ENCOUNTER — Other Ambulatory Visit: Payer: 59

## 2022-09-08 ENCOUNTER — Other Ambulatory Visit: Payer: Self-pay | Admitting: Obstetrics & Gynecology

## 2022-09-08 NOTE — Telephone Encounter (Signed)
Medication refill request: Micronor  Last AEX:  07/20/21 Next AEX: not scheduled. Note send to Pharmacy for patient to schedule before more refills can be given  Last MMG (if hormonal medication request): na  Refill authorized: #[id number] with 0 rf

## 2022-09-29 ENCOUNTER — Other Ambulatory Visit: Payer: Self-pay | Admitting: Radiology

## 2022-10-02 NOTE — Telephone Encounter (Signed)
Medication refill request: micronor Last AEX:  07-20-21 Next AEX: message sent to scheduling department to schedule Last MMG (if hormonal medication request): 04-20-22 birads 1:neg Refill authorized: rx was sent 09-08-22 for & message stating for patient to call to schedule yearly exam for further refills. This refill came in requesting supply. Please approve or deny as appropriate

## 2022-10-12 ENCOUNTER — Other Ambulatory Visit: Payer: Self-pay

## 2022-10-12 DIAGNOSIS — N921 Excessive and frequent menstruation with irregular cycle: Secondary | ICD-10-CM

## 2022-10-12 NOTE — Telephone Encounter (Signed)
Med refill request: POPs Last AEX: 07/20/2021-ML Next AEX: 01/05/2023-EB Last MMG (if hormonal med): 04/20/2022-neg birads 1 Refill authorized: rx pend

## 2022-10-13 MED ORDER — NORETHINDRONE 0.35 MG PO TABS: 1.0000 | ORAL_TABLET | Freq: Every day | ORAL | 0 refills | Status: DC

## 2022-11-15 ENCOUNTER — Ambulatory Visit: Payer: 59 | Admitting: Gastroenterology

## 2022-12-10 IMAGING — US US PELVIS COMPLETE WITH TRANSVAGINAL
1 series · 12 of 25 positions shown · non-contrast
Comparison: None available.
COMPARISON: None available.

Addendum:
CLINICAL DATA: Initial evaluation for palpable nodules at lower
abdomen.



[Series 1: us pelvis complete with transvaginal · 0.25mm/px · 12 of 75 slices shown]
[im 4/75]
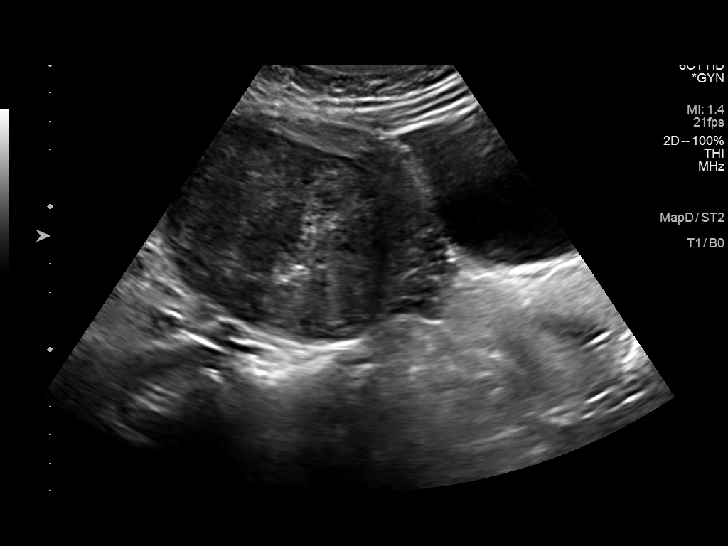
[im 10/75]
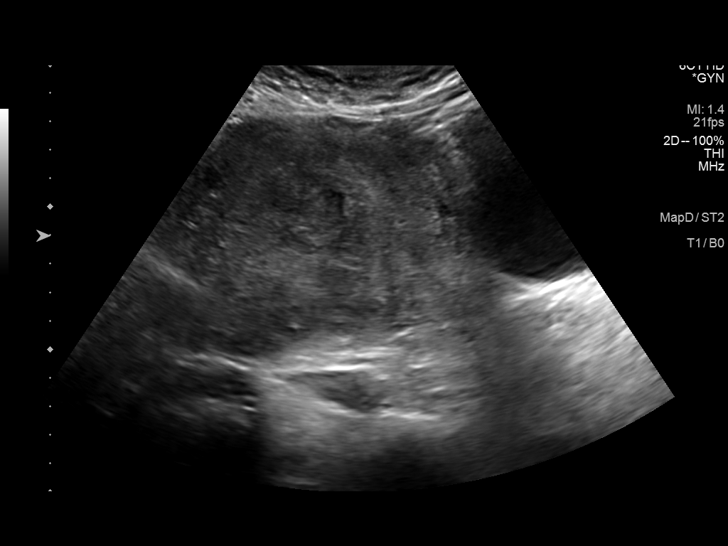
[im 16/75]
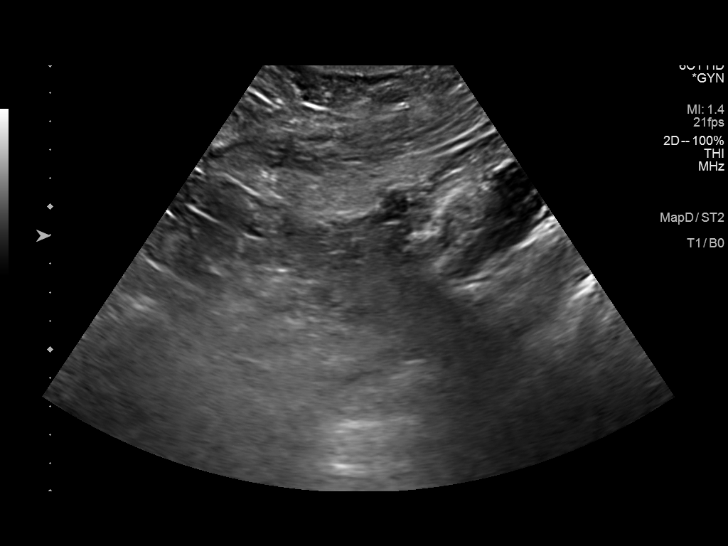
[im 22/75]
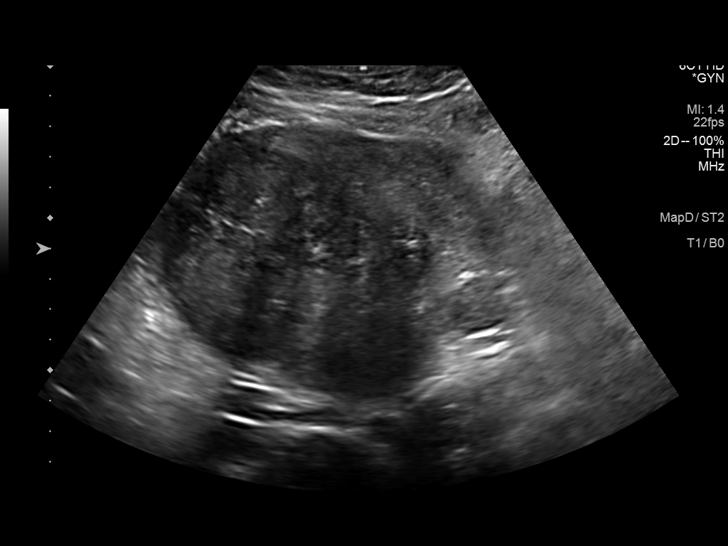
[im 28/75]
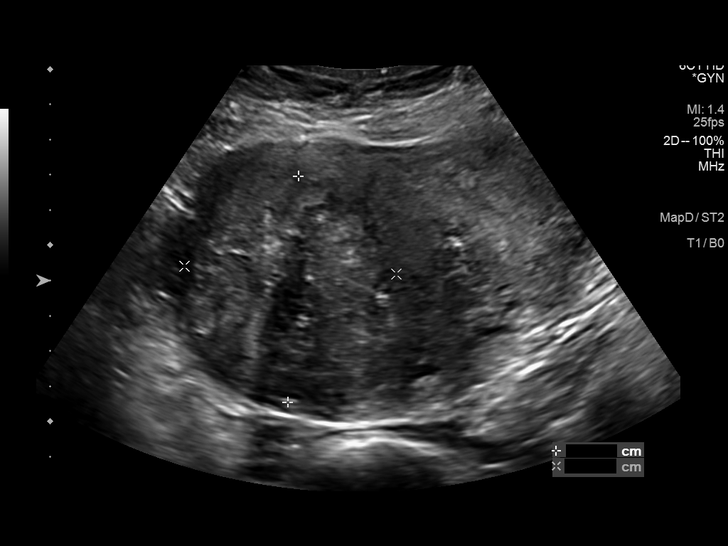
[im 34/75]
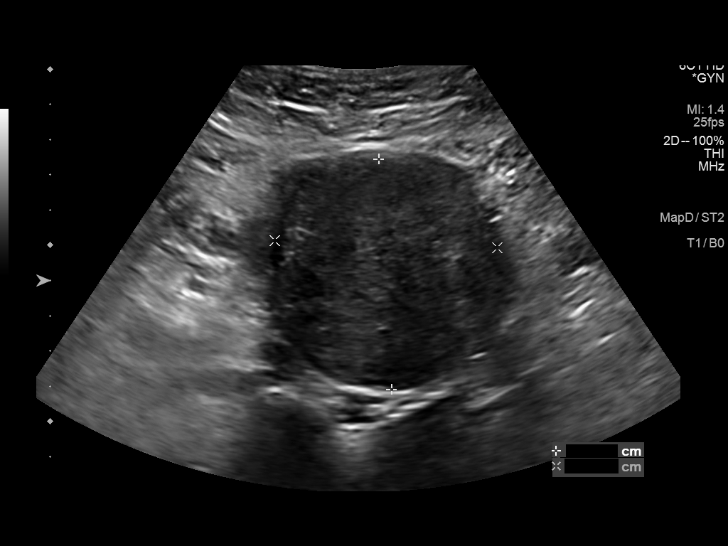
[im 41/75]
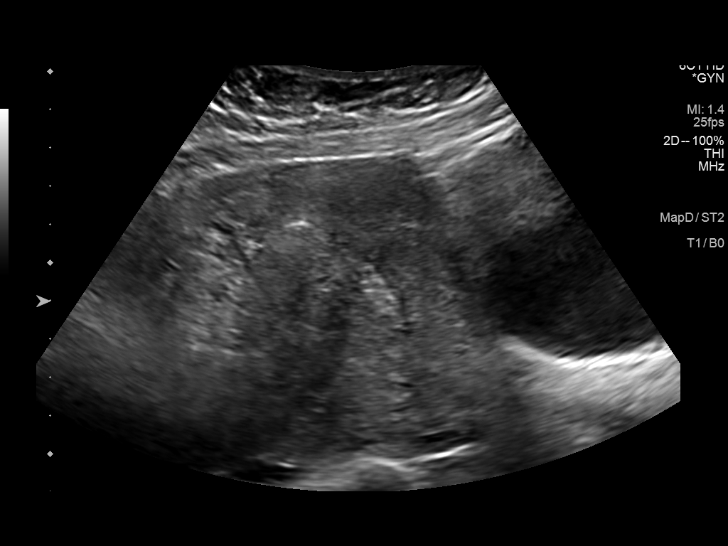
[im 47/75]
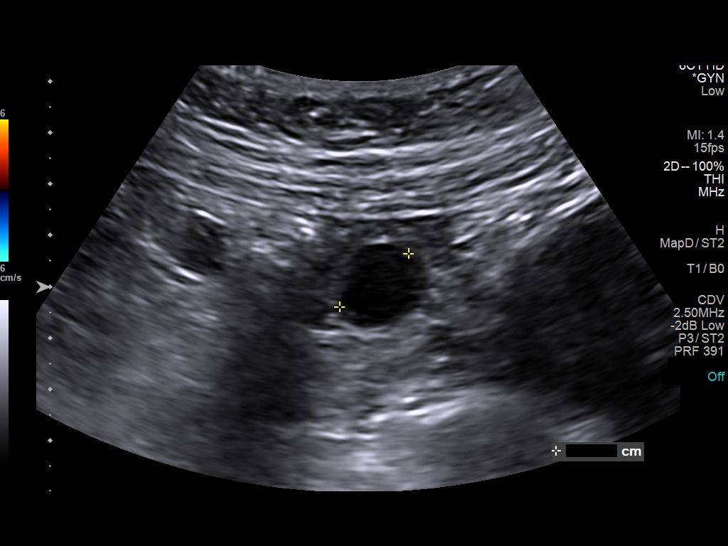
[im 53/75]
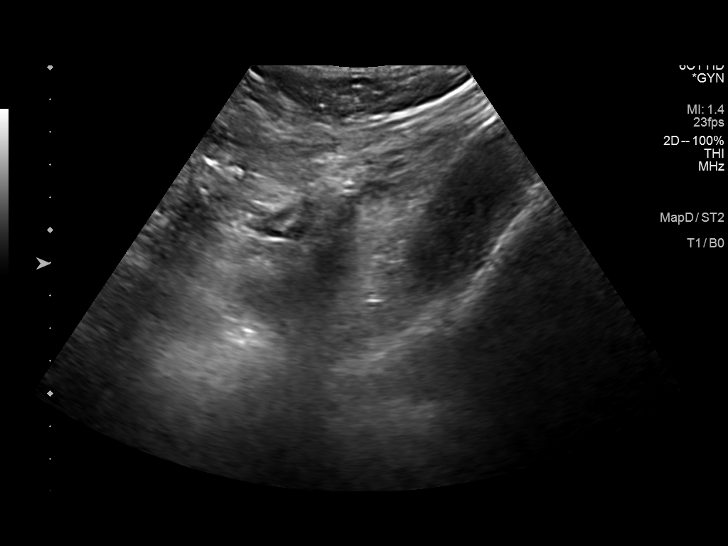
[im 59/75]
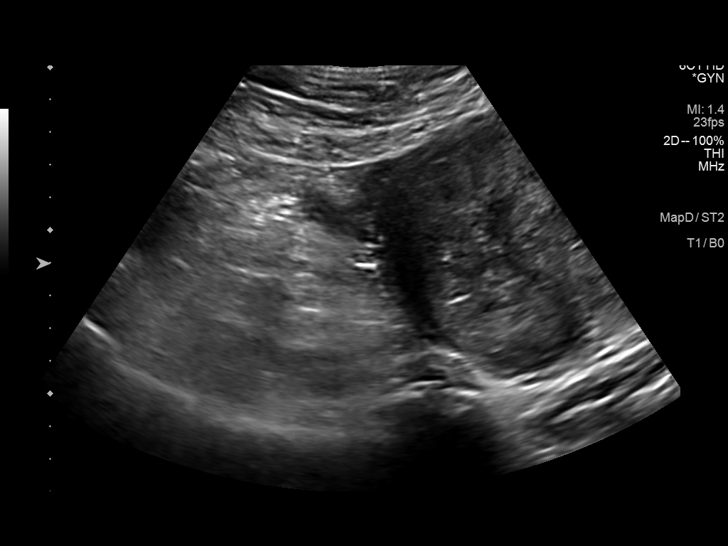
[im 65/75]
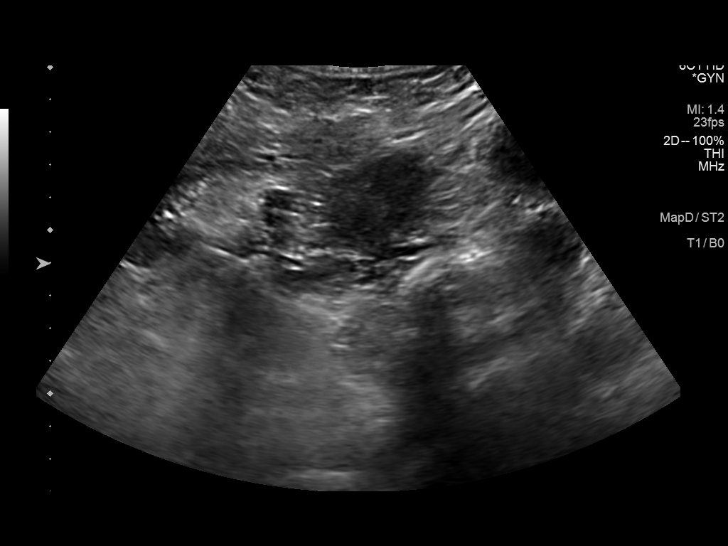
[im 71/75]
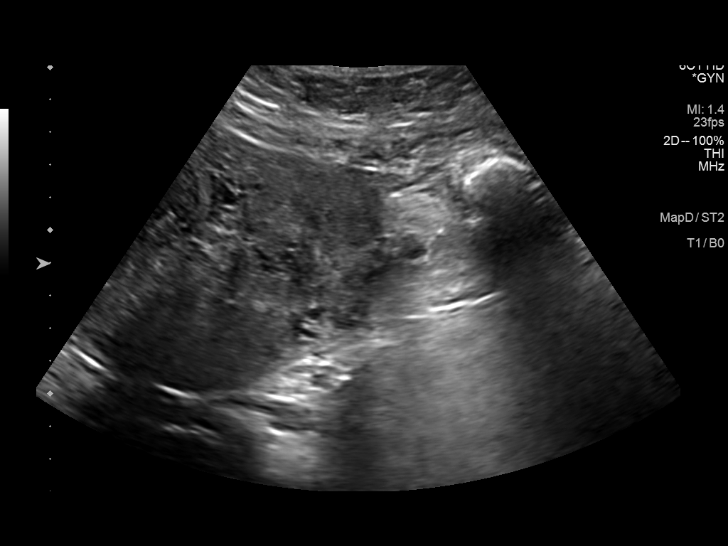

[12 of 25 positions shown; findings below may reference images not displayed]

FINDINGS: Uterus

Measurements: 17.6 x 9.0 x 13.1 cm = volume: 10 80 mL. Uterus is
enlarged with multiple fibroids present, 3 largest of which are
measured.

1. 7.9 x 6.4 x 6.0 cm intramural fibroid present at the right
uterine body.
2. Simple in 0 x 6.5 x 6.3 cm exophytic fibroid extends from the
central uterine fundus.
3. 6.6 x 4.4 x 5.6 cm subserosal fibroid present at the left
anterior uterine body.

Endometrium

Thickness: 8 mm.  No focal abnormality visualized.

Right ovary

Measurements: 3.5 x 1.7 x 2.7 cm = volume: 8.0 mL. Normal
appearance/no adnexal mass. 1.7 cm dominant follicle noted.

Left ovary

Measurements: 4.2 x 3.1 x 3.6 cm = volume: 24.0 mL. 2.3 x 2.2 x
cm mildly complex cyst with a few scattered internal reticular
echoes, which could reflect a hemorrhagic cyst or possibly mildly
complex follicular cyst. No internal vascularity or solid component.

Other findings

No abnormal free fluid.
IMPRESSION: 1. Enlarged fibroid uterus as detailed above.
2. 2.5 cm mildly complex left ovarian cyst, indeterminate, but could
reflect a hemorrhagic cyst or possibly a mildly complex follicular
cyst. While this is almost certainly benign, a short interval
follow-up ultrasound in 6-12 weeks could be performed for further
evaluation as warranted.
3. Normal sonographic appearance of the endometrium and right ovary.
No free fluid.

ADDENDUM:
There is a discrepancy in the order and the examination performed.
This ultrasound consists of a transabdominal only study. No
transvaginal imaging was performed.

*** End of Addendum ***
FINDINGS: Uterus

Measurements: 17.6 x 9.0 x 13.1 cm = volume: 10 80 mL. Uterus is
enlarged with multiple fibroids present, 3 largest of which are
measured.

1. 7.9 x 6.4 x 6.0 cm intramural fibroid present at the right
uterine body.
2. Simple in 0 x 6.5 x 6.3 cm exophytic fibroid extends from the
central uterine fundus.
3. 6.6 x 4.4 x 5.6 cm subserosal fibroid present at the left
anterior uterine body.

Endometrium

Thickness: 8 mm.  No focal abnormality visualized.

Right ovary

Measurements: 3.5 x 1.7 x 2.7 cm = volume: 8.0 mL. Normal
appearance/no adnexal mass. 1.7 cm dominant follicle noted.

Left ovary

Measurements: 4.2 x 3.1 x 3.6 cm = volume: 24.0 mL. 2.3 x 2.2 x
cm mildly complex cyst with a few scattered internal reticular
echoes, which could reflect a hemorrhagic cyst or possibly mildly
complex follicular cyst. No internal vascularity or solid component.

Other findings

No abnormal free fluid.
IMPRESSION: 1. Enlarged fibroid uterus as detailed above.
2. 2.5 cm mildly complex left ovarian cyst, indeterminate, but could
reflect a hemorrhagic cyst or possibly a mildly complex follicular
cyst. While this is almost certainly benign, a short interval
follow-up ultrasound in 6-12 weeks could be performed for further
evaluation as warranted.
3. Normal sonographic appearance of the endometrium and right ovary.
No free fluid.

## 2022-12-31 ENCOUNTER — Other Ambulatory Visit: Payer: Self-pay | Admitting: Obstetrics and Gynecology

## 2022-12-31 DIAGNOSIS — N921 Excessive and frequent menstruation with irregular cycle: Secondary | ICD-10-CM

## 2023-01-01 NOTE — Telephone Encounter (Signed)
Med refill request: POPs Last AEX: 07/20/2021-ML Next AEX: 01/05/2023-EB Last MMG (if hormonal med): 04/20/2022-neg birads 1 Refill authorized: rx pend

## 2023-01-05 ENCOUNTER — Other Ambulatory Visit (HOSPITAL_COMMUNITY)
Admission: RE | Admit: 2023-01-05 | Discharge: 2023-01-05 | Disposition: A | Discharge status: Home / Self Care | Payer: 59 | Source: Ambulatory Visit | Attending: Obstetrics and Gynecology | Admitting: Obstetrics and Gynecology

## 2023-01-05 ENCOUNTER — Encounter: Payer: Self-pay | Admitting: Obstetrics and Gynecology

## 2023-01-05 ENCOUNTER — Ambulatory Visit (INDEPENDENT_AMBULATORY_CARE_PROVIDER_SITE_OTHER): Payer: 59 | Admitting: Obstetrics and Gynecology

## 2023-01-05 VITALS — BP 128/88 | HR 72 | Ht 72.5 in | Wt 186.0 lb

## 2023-01-05 DIAGNOSIS — Z01419 Encounter for gynecological examination (general) (routine) without abnormal findings: Secondary | ICD-10-CM

## 2023-01-05 DIAGNOSIS — D219 Benign neoplasm of connective and other soft tissue, unspecified: Secondary | ICD-10-CM

## 2023-01-05 DIAGNOSIS — Z1231 Encounter for screening mammogram for malignant neoplasm of breast: Secondary | ICD-10-CM

## 2023-01-05 DIAGNOSIS — N950 Postmenopausal bleeding: Secondary | ICD-10-CM | POA: Diagnosis not present

## 2023-01-05 DIAGNOSIS — N938 Other specified abnormal uterine and vaginal bleeding: Secondary | ICD-10-CM

## 2023-01-05 NOTE — Progress Notes (Signed)
 58 y.o. y.o. female here for annual exam. Patient's last menstrual period was 12/09/2022. Period Duration (Days): 24 Period Pattern: (!) Irregular Menstrual Flow: Light, Moderate Menstrual Control: Maxi pad  Can bleed for 26 to 29 days. Can go away but then comes back like another period. Sometimes bleeding is heavy. Feels pelvic pressure at times Known fibroids Last FSH 95  Dxa: not done Mammogram: due this year Colonoscopy: this year. Q3 years polyps seen  HPI: Left Ovarian cyst on Pelvic US  in 11/2020.  No pelvic pain currently.  Perimenopausal with FSH at 95.1 on 03/09/22.  LMP 01/31/2022.  EBx 02/17/22 Benign.    FINAL MICROSCOPIC DIAGNOSIS:   A. ENDOMETRIUM, BIOPSY on 02/17/22 Inactive endometrium.  Negative for atypia, hyperplasia or malignancy.    US  Transvaginal Non-OB (Accession [id number]) (Order [id number]) Imaging Date: 03/30/2022 Department: [facility] Released By: Verneda, Alora  E, CMA Authorizing: Lavoie, Marie-Lyne, MD   Exam Status  Status  Final [99]   PACS Intelerad Image Link   Show images for US  Transvaginal Non-OB Related Results   US  Pelvis Complete In process 03/30/2022 9:38 AM         Study Result  Narrative & Impression  Comparison is made with previous scan in November 2022.  Both transabdominal and transvaginal techniques were necessary to evaluate the anatomy.  Anteverted, enlarged, irregular uterus with large subserosal fibroids.  No change in size of the fibroids measured previously.  The uterus is measured at 13.14 x 12.23 x 14.32 cm.  The endometrial lining is thin and symmetrical measured at 4.32 mm.  No mass or abnormal flow is seen at the endometrium.  The cervical canal is now normal.  Both ovaries are normal in size.  The right ovary shows no visible follicles.  The left ovary shows a single simple follicle measured at 1.6 cm.  The previously seen left ovarian cyst is no longer present.  No free fluid in  the pelvis.     Body mass index is 24.88 kg/m.     04/20/2022    8:32 AM 04/13/2021    8:36 AM 04/07/2020    8:37 AM  Depression screen PHQ 2/9  Decreased Interest 0 0 0  Down, Depressed, Hopeless 0 0 0  PHQ - 2 Score 0 0 0    Blood pressure 128/88, pulse 72, height 6' 0.5 (1.842 m), weight 186 lb (84.4 kg), last menstrual period 12/09/2022, SpO2 99%.     Component Value Date/Time   DIAGPAP  05/18/2020 1113    - Negative for intraepithelial lesion or malignancy (NILM)   HPVHIGH Negative 05/18/2020 1113   ADEQPAP  05/18/2020 1113    Satisfactory for evaluation; transformation zone component PRESENT.    GYN HISTORY:    Component Value Date/Time   DIAGPAP  05/18/2020 1113    - Negative for intraepithelial lesion or malignancy (NILM)   HPVHIGH Negative 05/18/2020 1113   ADEQPAP  05/18/2020 1113    Satisfactory for evaluation; transformation zone component PRESENT.    OB History  Gravida Para Term Preterm AB Living  1 1 1   1   SAB IAB Ectopic Multiple Live Births          # Outcome Date GA Lbr Len/2nd Weight Sex Type Anes PTL Lv  1 Term             Past Medical History:  Diagnosis Date   Allergic rhinitis, cause unspecified    dust, pollen, dogs   Allergy  BCC (basal cell carcinoma), face    x2.  Dr. Cary   Deviated septum    Hypertension    Raynaud disease    Runner's knee    Sleep apnea    no CPAP   Superficial basal cell carcinoma 11/03/2020   left superior medial chest-Dr. Jolene Jewell   Tennis elbow    right   Vitamin D  deficiency 03/2010    Past Surgical History:  Procedure Laterality Date   BCC- Left face     x2 (left face)--Dr. Cary   DILATATION & CURETTAGE/HYSTEROSCOPY WITH MYOSURE N/A 12/17/2020   Procedure: DILATATION & CURETTAGE/HYSTEROSCOPY WITH MYOSURE;  Surgeon: Lavoie, Marie-Lyne, MD;  Location: [facility];  Service: Gynecology;  Laterality: N/A;   episiotomy      Current Outpatient Medications on File Prior to  Visit  Medication Sig Dispense Refill   amLODipine  (NORVASC ) 2.5 MG tablet Take 1 tablet (2.5 mg total) by mouth daily. 90 tablet 3   Ascorbic Acid (VITAMIN C PO) Take 500 mg by mouth daily.     famotidine (PEPCID) 20 MG tablet Take 20 mg by mouth as needed for heartburn or indigestion.     fexofenadine (ALLEGRA) 180 MG tablet Take 180 mg by mouth daily. Reported on 06/22/2015     Magnesium 250 MG TABS Take 1 tablet by mouth daily.     norethindrone  (JENCYCLA ) 0.35 MG tablet TAKE 1 TABLET BY MOUTH EVERY DAY 28 tablet 0   VITAMIN D  PO Take 2,000 Int'l Units by mouth.     No current facility-administered medications on file prior to visit.    Social History   Socioeconomic History   Marital status: Married    Spouse name: Not on file   Number of children: 1   Years of education: Not on file   Highest education level: Not on file  Occupational History   Occupation: homemaker  Tobacco Use   Smoking status: Never   Smokeless tobacco: Never  Vaping Use   Vaping status: Never Used  Substance and Sexual Activity   Alcohol use: No   Drug use: No   Sexual activity: Yes    Partners: Male    Birth control/protection: Pill  Other Topics Concern   Not on file  Social History Narrative   Married, no pets.   Son graduated from [employer] at [employer], start [employer] fall 2020, graduated 05/2021.   Son works for [employer], lives in [place].      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)      Updated 04/2022   Social Drivers of Health   Financial Resource Strain: Not on file  Food Insecurity: Not on file  Transportation Needs: Not on file  Physical Activity: Sufficiently Active (11/06/2016)   Exercise Vital Sign    Days of Exercise per Week: 4 days    Minutes of Exercise per Session: 60 min  Stress: No Stress Concern Present (11/06/2016)   [employer] of Occupational Health - Occupational Stress Questionnaire    Feeling of Stress : Not at all  Social Connections: Unknown (11/06/2016)    Social Connection and Isolation Panel [NHANES]    Frequency of Communication with Friends and Family: Not on file    Frequency of Social Gatherings with Friends and Family: Not on file    Attends Religious Services: Not on file    Active Member of Clubs or Organizations: Not on file    Attends Banker Meetings: Not on file    Marital  Status: Married  Catering Manager Violence: Not on file    Family History  Problem Relation Age of Onset   Glaucoma Mother    Diabetes Mother    Thyroid disease Mother        s/p thyroid removal. Unclear if had hyperthyroid at 1 time   Cataracts Mother        had surgery   Basal cell carcinoma Father    Multiple sclerosis Father    Kidney Stones Father    Cancer Father    Hypertension Father    Irritable bowel syndrome Brother    Kidney Stones Brother    Anxiety disorder Brother    Cancer Maternal Grandmother        brain tumor   Stroke Paternal Grandmother 5   Cancer Paternal Grandfather        melanoma   Melanoma Paternal Grandfather    Raynaud syndrome Son    Ovarian cancer Maternal Aunt    Cancer Maternal Aunt    Skin cancer Paternal Aunt        some aunts with basal cell CA   Melanoma Paternal Aunt    Cancer Paternal Aunt    Cancer Paternal Aunt    Breast cancer Neg Hx    Colon cancer Neg Hx    Esophageal cancer Neg Hx    Rectal cancer Neg Hx    Stomach cancer Neg Hx    Colon polyps Neg Hx      No Known Allergies    Patient's last menstrual period was Patient's last menstrual period was 12/09/2022.SABRA            Review of Systems Alls systems reviewed and are negative.     Physical Exam Constitutional:      Appearance: Normal appearance.  Genitourinary:     Vulva and urethral meatus normal.     No lesions in the vagina.     Right Labia: No rash, lesions or skin changes.    Left Labia: No lesions, skin changes or rash.    No vaginal discharge or tenderness.     No vaginal prolapse present.    No  vaginal atrophy present.     Right Adnexa: not tender, not palpable and no mass present.    Left Adnexa: not tender, not palpable and no mass present.    No cervical motion tenderness or discharge.     Uterus is enlarged, tender and irregular.  Breasts:    Right: Normal.     Left: Normal.  HENT:     Head: Normocephalic.  Neck:     Thyroid: No thyroid mass, thyromegaly or thyroid tenderness.  Cardiovascular:     Rate and Rhythm: Normal rate and regular rhythm.     Heart sounds: Normal heart sounds, S1 normal and S2 normal.  Pulmonary:     Effort: Pulmonary effort is normal.     Breath sounds: Normal breath sounds and air entry.  Abdominal:     General: There is no distension.     Palpations: Abdomen is soft. There is no mass.     Tenderness: There is no abdominal tenderness. There is no guarding or rebound.  Musculoskeletal:        General: Normal range of motion.     Cervical back: Full passive range of motion without pain, normal range of motion and neck supple. No tenderness.     Right lower leg: No edema.     Left lower leg: No edema.  Neurological:  Mental Status: She is alert.  Skin:    General: Skin is warm.  Psychiatric:        Mood and Affect: Mood normal.        Behavior: Behavior normal.        Thought Content: Thought content normal.  Vitals and nursing note reviewed. Exam conducted with a chaperone present.       A:         Well Woman GYN exam, fibroids, PMB, DUB, recent EMB in March, h/o ovarian cyst                             P:        Pap smear collected today Encouraged annual mammogram screening Colon cancer screening up-to-date DXA not indicated Labs and immunizations to do with PMD Symptomatic fibroid uterus with persistent PMB:  Counseled on all options and r/b/a/I of each.  Counseled on progesterone only options and the [facility].  She is not a candidate for an ablation being in menopause(FSH 95) to repeat again today. Counseled extensively on the  [facility] procedure including but not limited to what to expect and risks and benefits.  Counseled on postop care and pelvic rest for 10 weeks after the surgery with restricted lifting for 6 weeks after.  Counseled on the benefits of the robotic procedure with faster return to daily activities, improved outcomes, and less risk for complications. She is considering this option and will let us  know.  RTC for PUS. EMB with persistent bleeding and conservative care, since she will be at a year in March from her last biopsy.  30 minutes spent on reviewing records, imaging,  and one on one patient time and counseling patient and documentation Dr. Glennon  No follow-ups on file.  Almarie MARLA Glennon

## 2023-01-06 LAB — SURESWAB® ADVANCED VAGINITIS PLUS,TMA
C. trachomatis RNA, TMA: NOT DETECTED
CANDIDA SPECIES: NOT DETECTED
Candida glabrata: NOT DETECTED
N. gonorrhoeae RNA, TMA: NOT DETECTED
SURESWAB(R) ADV BACTERIAL VAGINOSIS(BV),TMA: NEGATIVE
TRICHOMONAS VAGINALIS (TV),TMA: NOT DETECTED

## 2023-01-06 LAB — FOLLICLE STIMULATING HORMONE: FSH: 92.4 m[IU]/mL

## 2023-01-06 LAB — ESTRADIOL: Estradiol: 34 pg/mL

## 2023-01-06 LAB — VITAMIN D 25 HYDROXY (VIT D DEFICIENCY, FRACTURES): Vit D, 25-Hydroxy: 22 ng/mL — ABNORMAL LOW (ref 30–100)

## 2023-01-09 LAB — CYTOLOGY - PAP
Comment: NEGATIVE
Diagnosis: NEGATIVE
High risk HPV: NEGATIVE

## 2023-01-25 ENCOUNTER — Other Ambulatory Visit: Payer: Self-pay | Admitting: Obstetrics and Gynecology

## 2023-01-25 DIAGNOSIS — N921 Excessive and frequent menstruation with irregular cycle: Secondary | ICD-10-CM

## 2023-01-25 NOTE — Telephone Encounter (Signed)
Med refill request: POPs Last AEX: 01/05/23 Next OV: 02/15/23 Last MMG (if hormonal med): 04/20/2022-neg birads 1 Refill authorized: rx pend, #[id number], 0 RF

## 2023-02-08 ENCOUNTER — Encounter: Payer: Self-pay | Admitting: Gastroenterology

## 2023-02-15 ENCOUNTER — Ambulatory Visit (INDEPENDENT_AMBULATORY_CARE_PROVIDER_SITE_OTHER): Payer: 59 | Admitting: Obstetrics and Gynecology

## 2023-02-15 ENCOUNTER — Other Ambulatory Visit: Payer: Self-pay | Admitting: Obstetrics and Gynecology

## 2023-02-15 ENCOUNTER — Encounter: Payer: Self-pay | Admitting: Obstetrics and Gynecology

## 2023-02-15 ENCOUNTER — Other Ambulatory Visit (HOSPITAL_COMMUNITY)
Admission: RE | Admit: 2023-02-15 | Discharge: 2023-02-15 | Disposition: A | Discharge status: Home / Self Care | Payer: 59 | Source: Ambulatory Visit | Attending: Obstetrics and Gynecology | Admitting: Obstetrics and Gynecology

## 2023-02-15 ENCOUNTER — Ambulatory Visit: Payer: 59

## 2023-02-15 VITALS — BP 120/80 | HR 72 | Resp 16

## 2023-02-15 DIAGNOSIS — N938 Other specified abnormal uterine and vaginal bleeding: Secondary | ICD-10-CM | POA: Diagnosis not present

## 2023-02-15 DIAGNOSIS — N950 Postmenopausal bleeding: Secondary | ICD-10-CM | POA: Diagnosis present

## 2023-02-15 DIAGNOSIS — Z1231 Encounter for screening mammogram for malignant neoplasm of breast: Secondary | ICD-10-CM

## 2023-02-15 DIAGNOSIS — D219 Benign neoplasm of connective and other soft tissue, unspecified: Secondary | ICD-10-CM

## 2023-02-15 DIAGNOSIS — Z01419 Encounter for gynecological examination (general) (routine) without abnormal findings: Secondary | ICD-10-CM

## 2023-02-15 DIAGNOSIS — Z01812 Encounter for preprocedural laboratory examination: Secondary | ICD-10-CM

## 2023-02-15 DIAGNOSIS — Z712 Person consulting for explanation of examination or test findings: Secondary | ICD-10-CM

## 2023-02-15 LAB — PREGNANCY, URINE: Preg Test, Ur: NEGATIVE

## 2023-02-15 NOTE — Progress Notes (Unsigned)
58 y.o. y.o. female here for EMB and pUS No LMP recorded (within days). Period Duration (Days):  (varies) Period Pattern: (!) Irregular Menstrual Flow:  (varies) Menstrual Control: Maxi pad Dysmenorrhea: None  Can bleed for 26 to 29 days. Can go away but then comes back like another period. Sometimes bleeding is heavy. Feels pelvic pressure at times Known fibroids On progesterone only ocp Jencycla Recent FSH 95  PUS 16.02cm fibroid uterus 4 fibroids measured 9.06cm, 6.61cm, 2.67, 1.67cm EML 5.8mm Normal ovaries No adnexal masses No free fluid  Dxa: not done Mammogram: due this year Colonoscopy: this year. Q3 years polyps seen  HPI: Left Ovarian cyst on Pelvic US in 11/2020.  No pelvic pain currently.  Perimenopausal with FSH at 95.1 on 03/09/22.  LMP 01/31/2022.  EBx 02/17/22 Benign.    FINAL MICROSCOPIC DIAGNOSIS:   A. ENDOMETRIUM, BIOPSY on 02/17/22 Inactive endometrium.  Negative for atypia, hyperplasia or malignancy.    US Transvaginal Non-OB (Accession [id number]) (Order [id number]) Imaging Date: 03/30/2022 Department: [facility] Released By: Lillia Carmel, CMA Authorizing: Genia Del, MD   Exam Status  Status  Final [99]   PACS Intelerad Image Link   Show images for US Transvaginal Non-OB Related Results   US Pelvis Complete In process 03/30/2022 9:38 AM         Study Result  Narrative & Impression  Comparison is made with previous scan in November 2022.  Both transabdominal and transvaginal techniques were necessary to evaluate the anatomy.  Anteverted, enlarged, irregular uterus with large subserosal fibroids.  No change in size of the fibroids measured previously.  The uterus is measured at 13.14 x 12.23 x 14.32 cm.  The endometrial lining is thin and symmetrical measured at 4.32 mm.  No mass or abnormal flow is seen at the endometrium.  The cervical canal is now normal.  Both ovaries are normal in size.  The  right ovary shows no visible follicles.  The left ovary shows a single simple follicle measured at 1.6 cm.  The previously seen left ovarian cyst is no longer present.  No free fluid in the pelvis.     There is no height or weight on file to calculate BMI.     04/20/2022    8:32 AM 04/13/2021    8:36 AM 04/07/2020    8:37 AM  Depression screen PHQ 2/9  Decreased Interest 0 0 0  Down, Depressed, Hopeless 0 0 0  PHQ - 2 Score 0 0 0    Blood pressure 120/80, pulse 72, resp. rate 16.     Component Value Date/Time   DIAGPAP  01/05/2023 1153    - Negative for intraepithelial lesion or malignancy (NILM)   DIAGPAP  05/18/2020 1113    - Negative for intraepithelial lesion or malignancy (NILM)   HPVHIGH Negative 01/05/2023 1153   HPVHIGH Negative 05/18/2020 1113   ADEQPAP  01/05/2023 1153    Satisfactory for evaluation. The presence or absence of an   ADEQPAP  01/05/2023 1153    endocervical/transformation zone component cannot be determined because   ADEQPAP of atrophy. 01/05/2023 1153    GYN HISTORY:    Component Value Date/Time   DIAGPAP  01/05/2023 1153    - Negative for intraepithelial lesion or malignancy (NILM)   DIAGPAP  05/18/2020 1113    - Negative for intraepithelial lesion or malignancy (NILM)   HPVHIGH Negative 01/05/2023 1153   HPVHIGH Negative 05/18/2020 1113   ADEQPAP  01/05/2023 1153  Satisfactory for evaluation. The presence or absence of an   ADEQPAP  01/05/2023 1153    endocervical/transformation zone component cannot be determined because   ADEQPAP of atrophy. 01/05/2023 1153    OB History  Gravida Para Term Preterm AB Living  1 1 1   1   SAB IAB Ectopic Multiple Live Births          # Outcome Date GA Lbr Len/2nd Weight Sex Type Anes PTL Lv  1 Term             Past Medical History:  Diagnosis Date   Allergic rhinitis, cause unspecified    dust, pollen, dogs   Allergy    BCC (basal cell carcinoma), face    x2.  Dr. Nicholas Lose   Deviated septum     Hypertension    Raynaud disease    Runner's knee    Sleep apnea    no CPAP   Superficial basal cell carcinoma 11/03/2020   left superior medial chest-Dr. Theador Hawthorne   Tennis elbow    right   Vitamin D deficiency 03/2010    Past Surgical History:  Procedure Laterality Date   BCC- Left face     x2 (left face)--Dr. Nicholas Lose   DILATATION & CURETTAGE/HYSTEROSCOPY WITH MYOSURE N/A 12/17/2020   Procedure: DILATATION & CURETTAGE/HYSTEROSCOPY WITH MYOSURE;  Surgeon: Genia Del, MD;  Location: [facility];  Service: Gynecology;  Laterality: N/A;   episiotomy      Current Outpatient Medications on File Prior to Visit  Medication Sig Dispense Refill   amLODipine (NORVASC) 2.5 MG tablet Take 1 tablet (2.5 mg total) by mouth daily. 90 tablet 3   Ascorbic Acid (VITAMIN C PO) Take 500 mg by mouth daily.     famotidine (PEPCID) 20 MG tablet Take 20 mg by mouth as needed for heartburn or indigestion.     fexofenadine (ALLEGRA) 180 MG tablet Take 180 mg by mouth as needed. Reported on 06/22/2015     JENCYCLA 0.35 MG tablet TAKE 1 TABLET BY MOUTH EVERY DAY 84 tablet 0   Magnesium 250 MG TABS Take 1 tablet by mouth daily.     VITAMIN D PO Take 4,000 Int'l Units by mouth.     No current facility-administered medications on file prior to visit.    Social History   Socioeconomic History   Marital status: Married    Spouse name: Not on file   Number of children: 1   Years of education: Not on file   Highest education level: Not on file  Occupational History   Occupation: homemaker  Tobacco Use   Smoking status: Never   Smokeless tobacco: Never  Vaping Use   Vaping status: Never Used  Substance and Sexual Activity   Alcohol use: No   Drug use: No   Sexual activity: Yes    Partners: Male    Birth control/protection: Pill  Other Topics Concern   Not on file  Social History Narrative   Married, no pets.   Son graduated from [employer] at [place], start [employer]  fall 2020, graduated 05/2021.   Son works for [employer], lives in [place].      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)      Updated 04/2022   Social Drivers of Health   Financial Resource Strain: Not on file  Food Insecurity: Not on file  Transportation Needs: Not on file  Physical Activity: Sufficiently Active (11/06/2016)   Exercise Vital Sign    Days of Exercise  per Week: 4 days    Minutes of Exercise per Session: 60 min  Stress: No Stress Concern Present (11/06/2016)   [employer] of Occupational Health - Occupational Stress Questionnaire    Feeling of Stress : Not at all  Social Connections: Unknown (11/06/2016)   Social Connection and Isolation Panel [NHANES]    Frequency of Communication with Friends and Family: Not on file    Frequency of Social Gatherings with Friends and Family: Not on file    Attends Religious Services: Not on file    Active Member of Clubs or Organizations: Not on file    Attends Banker Meetings: Not on file    Marital Status: Married  Catering manager Violence: Not on file    Family History  Problem Relation Age of Onset   Glaucoma Mother    Diabetes Mother    Thyroid disease Mother        s/p thyroid removal. Unclear if had hyperthyroid at 1 time   Cataracts Mother        had surgery   Basal cell carcinoma Father    Multiple sclerosis Father    Kidney Stones Father    Cancer Father    Hypertension Father    Irritable bowel syndrome Brother    Kidney Stones Brother    Anxiety disorder Brother    Cancer Maternal Grandmother        brain tumor   Stroke Paternal Grandmother 36   Cancer Paternal Grandfather        melanoma   Melanoma Paternal Grandfather    Raynaud syndrome Son    Ovarian cancer Maternal Aunt    Cancer Maternal Aunt    Skin cancer Paternal Aunt        some aunts with basal cell CA   Melanoma Paternal Aunt    Cancer Paternal Aunt    Cancer Paternal Aunt    Breast cancer Neg Hx    Colon cancer Neg Hx     Esophageal cancer Neg Hx    Rectal cancer Neg Hx    Stomach cancer Neg Hx    Colon polyps Neg Hx      No Known Allergies    Patient's last menstrual period was No LMP recorded (within days)..            Review of Systems Alls systems reviewed and are negative.    last visit: Physical Exam Constitutional:      Appearance: Normal appearance.  Genitourinary:     Vulva and urethral meatus normal.     No lesions in the vagina.     Right Labia: No rash, lesions or skin changes.    Left Labia: No lesions, skin changes or rash.    No vaginal discharge or tenderness.     No vaginal prolapse present.    No vaginal atrophy present.     Right Adnexa: not tender, not palpable and no mass present.    Left Adnexa: not tender, not palpable and no mass present.    No cervical motion tenderness or discharge.     Uterus is enlarged, tender and irregular.  Breasts:    Right: Normal.     Left: Normal.  HENT:     Head: Normocephalic.  Neck:     Thyroid: No thyroid mass, thyromegaly or thyroid tenderness.  Cardiovascular:     Rate and Rhythm: Normal rate and regular rhythm.     Heart sounds: Normal heart sounds, S1 normal and S2 normal.  Pulmonary:     Effort: Pulmonary effort is normal.     Breath sounds: Normal breath sounds and air entry.  Abdominal:     General: There is no distension.     Palpations: Abdomen is soft. There is no mass.     Tenderness: There is no abdominal tenderness. There is no guarding or rebound.  Musculoskeletal:        General: Normal range of motion.     Cervical back: Full passive range of motion without pain, normal range of motion and neck supple. No tenderness.     Right lower leg: No edema.     Left lower leg: No edema.  Neurological:     Mental Status: She is alert.  Skin:    General: Skin is warm.  Psychiatric:        Mood and Affect: Mood normal.        Behavior: Behavior normal.        Thought Content: Thought content normal.  Vitals and  nursing note reviewed. Exam conducted with a chaperone present.   SURGICAL PATHOLOGY CASE: [id number] PATIENT: [name] Surgical Pathology Report     Clinical History: irregular menses (cm)     FINAL MICROSCOPIC DIAGNOSIS:  A. ENDOMETRIUM, BIOPSY: Inactive endometrium. Negative for atypia, hyperpl...    Cervical Cancer Screening History Report Surgical pathology( [place]) Order: [id number]  Status: Edited Result - FINAL     Next appt: 03/23/2023 at 02:00 PM in Gastroenterology ([facility] Pre[facility])     Dx: Irregular menses   Test Result Released: Yes (seen)   2 Result Notes    Component Ref Range & Units (hover) 12 mo ago  SURGICAL PATHOLOGY SURGICAL PATHOLOGY CASE: [id number] PATIENT: [name] Surgical Pathology Report     Clinical History: irregular menses (cm)     FINAL MICROSCOPIC DIAGNOSIS:  A. ENDOMETRIUM, BIOPSY: Inactive endometrium. Negative for atypia, hyperplasia or malignancy.        A:         [facility] exam, fibroids, PMB, DUB, recent EMB in March, h/o ovarian cyst                             P:         EMB collected today.  To notify patient of the results. Reviewed options again with patient.  Would not recommend ablation or [place] being in menopause. Can continue progesterone only and may still bleed.  Discussed option for evalution of fibroids with MRI.  With conservative care, will need repeat ultrasound and EMB after year with persistent bleeding.  Discussed she can continue progesterone only ocp's. May change to HRT, if needed.  She is also considering the [facility] to avoid multiple tests and cancer concerns in the future.  The procedure was discussed in detail. Counseled on ovarian preservation vs. Removal at the age of 69 and older. 30 minutes spent on reviewing records, imaging,  and one on one patient time and counseling patient and documentation Dr. Karma Greaser  No follow-ups on file.  [name]

## 2023-02-19 ENCOUNTER — Encounter: Payer: Self-pay | Admitting: Obstetrics and Gynecology

## 2023-02-19 LAB — SURGICAL PATHOLOGY

## 2023-03-23 ENCOUNTER — Ambulatory Visit: Payer: 59

## 2023-03-23 ENCOUNTER — Other Ambulatory Visit: Payer: Self-pay | Admitting: Family Medicine

## 2023-03-23 VITALS — Ht 72.0 in | Wt 186.0 lb

## 2023-03-23 DIAGNOSIS — Z1231 Encounter for screening mammogram for malignant neoplasm of breast: Secondary | ICD-10-CM

## 2023-03-23 DIAGNOSIS — Z860100 Personal history of colon polyps, unspecified: Secondary | ICD-10-CM

## 2023-03-23 MED ORDER — SUTAB 1479-225-188 MG PO TABS: ORAL_TABLET | ORAL | 0 refills | Status: DC

## 2023-03-23 NOTE — Progress Notes (Signed)

## 2023-03-30 ENCOUNTER — Telehealth: Payer: Self-pay | Admitting: Family Medicine

## 2023-03-30 NOTE — Telephone Encounter (Signed)
 Copied from CRM [id number]. Topic: Appointments - Scheduling Inquiry for Clinic >> Mar 30, 2023  3:54 PM Fuller Mandril wrote: Reason for CRM: Patient called. She has questions about Ancillary Orders appt that is showing for 03/23/2023. She does have a mammogram schedule but would like more information on the order. Thank You

## 2023-04-02 NOTE — Telephone Encounter (Signed)
 Called patient, this was a mammogram order.

## 2023-04-09 ENCOUNTER — Telehealth: Payer: Self-pay | Admitting: Gastroenterology

## 2023-04-09 NOTE — Telephone Encounter (Signed)
 Inbound call from patient requesting to discuss how tomorrow's colonoscopy 4/8 will be billed to her insurance. Please advise, thank you

## 2023-04-10 ENCOUNTER — Ambulatory Visit (AMBULATORY_SURGERY_CENTER): Payer: 59 | Admitting: Gastroenterology

## 2023-04-10 ENCOUNTER — Encounter: Payer: Self-pay | Admitting: Gastroenterology

## 2023-04-10 VITALS — BP 117/74 | HR 72 | Temp 97.9°F | Resp 18 | Ht 72.0 in | Wt 186.0 lb

## 2023-04-10 DIAGNOSIS — Z1211 Encounter for screening for malignant neoplasm of colon: Secondary | ICD-10-CM

## 2023-04-10 DIAGNOSIS — K5730 Diverticulosis of large intestine without perforation or abscess without bleeding: Secondary | ICD-10-CM

## 2023-04-10 DIAGNOSIS — K635 Polyp of colon: Secondary | ICD-10-CM | POA: Diagnosis not present

## 2023-04-10 DIAGNOSIS — D120 Benign neoplasm of cecum: Secondary | ICD-10-CM

## 2023-04-10 DIAGNOSIS — Z860100 Personal history of colon polyps, unspecified: Secondary | ICD-10-CM

## 2023-04-10 DIAGNOSIS — Z860101 Personal history of adenomatous and serrated colon polyps: Secondary | ICD-10-CM | POA: Diagnosis not present

## 2023-04-10 DIAGNOSIS — Z9889 Other specified postprocedural states: Secondary | ICD-10-CM | POA: Diagnosis not present

## 2023-04-10 MED ORDER — SODIUM CHLORIDE 0.9 % IV SOLN: 500.0000 mL | Freq: Once | INTRAVENOUS | Status: DC

## 2023-04-10 NOTE — Progress Notes (Signed)
 [facility] History and Physical   Primary Care Physician:  Joselyn Arrow, MD   Reason for Procedure:   History of colon polyps  Plan:    colonoscopy     HPI: [name] is a 58 y.o. female  here for colonoscopy surveillance - history of polyps, 5 adenomas removed 04/2020, largest 2cm in size..   Patient denies any bowel symptoms at this time. No family history of colon cancer known. Otherwise feels well without any cardiopulmonary symptoms.   I have discussed risks / benefits of anesthesia and endoscopic procedure with [name] and they wish to proceed with the exams as outlined today.    Past Medical History:  Diagnosis Date   Allergic rhinitis, cause unspecified    dust, pollen, dogs   Allergy    BCC (basal cell carcinoma), face    x2.  Dr. Nicholas Lose   Deviated septum    Hypertension    Raynaud disease    Runner's knee    Sleep apnea    no CPAP   Superficial basal cell carcinoma 11/03/2020   left superior medial chest-Dr. Theador Hawthorne   Tennis elbow    right   Vitamin D deficiency 03/2010    Past Surgical History:  Procedure Laterality Date   BCC- Left face     x2 (left face)--Dr. Nicholas Lose   DILATATION & CURETTAGE/HYSTEROSCOPY WITH MYOSURE N/A 12/17/2020   Procedure: DILATATION & CURETTAGE/HYSTEROSCOPY WITH MYOSURE;  Surgeon: Genia Del, MD;  Location: [facility];  Service: Gynecology;  Laterality: N/A;   episiotomy      Prior to Admission medications   Medication Sig Start Date End Date Taking? Authorizing Provider  amLODipine (NORVASC) 2.5 MG tablet Take 1 tablet (2.5 mg total) by mouth daily. 04/20/22  Yes Joselyn Arrow, MD  Ascorbic Acid (VITAMIN C PO) Take 500 mg by mouth daily.   Yes [provider]  famotidine (PEPCID) 20 MG tablet Take 20 mg by mouth as needed for heartburn or indigestion.   Yes [provider]  fexofenadine (ALLEGRA) 180 MG tablet Take 180 mg by mouth as needed.  Reported on 06/22/2015   Yes [provider]  JENCYCLA 0.35 MG tablet TAKE 1 TABLET BY MOUTH EVERY DAY 01/25/23  Yes Earley Favor, MD  Magnesium 250 MG TABS Take 1 tablet by mouth daily.   Yes [provider]  VITAMIN D PO Take 4,000 Int'l Units by mouth.   Yes [provider]    Current Outpatient Medications  Medication Sig Dispense Refill   amLODipine (NORVASC) 2.5 MG tablet Take 1 tablet (2.5 mg total) by mouth daily. 90 tablet 3   Ascorbic Acid (VITAMIN C PO) Take 500 mg by mouth daily.     famotidine (PEPCID) 20 MG tablet Take 20 mg by mouth as needed for heartburn or indigestion.     fexofenadine (ALLEGRA) 180 MG tablet Take 180 mg by mouth as needed. Reported on 06/22/2015     JENCYCLA 0.35 MG tablet TAKE 1 TABLET BY MOUTH EVERY DAY 84 tablet 0   Magnesium 250 MG TABS Take 1 tablet by mouth daily.     VITAMIN D PO Take 4,000 Int'l Units by mouth.     Current Facility-Administered Medications  Medication Dose Route Frequency Provider Last Rate Last Admin   0.9 %  sodium chloride infusion  500 mL Intravenous Once Duston Smolenski, Willaim Rayas, MD        Allergies as of 04/10/2023   (  No Known Allergies)    Family History  Problem Relation Age of Onset   Glaucoma Mother    Diabetes Mother    Thyroid disease Mother        s/p thyroid removal. Unclear if had hyperthyroid at 1 time   Cataracts Mother        had surgery   Basal cell carcinoma Father    Multiple sclerosis Father    Kidney Stones Father    Cancer Father    Hypertension Father    Irritable bowel syndrome Brother    Kidney Stones Brother    Anxiety disorder Brother    Cancer Maternal Grandmother        brain tumor   Stroke Paternal Grandmother 41   Cancer Paternal Grandfather        melanoma   Melanoma Paternal Grandfather    Raynaud syndrome Son    Ovarian cancer Maternal Aunt    Cancer Maternal Aunt    Skin cancer Paternal Aunt        some aunts with basal cell CA   Melanoma  Paternal Aunt    Cancer Paternal Aunt    Cancer Paternal Aunt    Breast cancer Neg Hx    Colon cancer Neg Hx    Esophageal cancer Neg Hx    Rectal cancer Neg Hx    Stomach cancer Neg Hx    Colon polyps Neg Hx     Social History   Socioeconomic History   Marital status: Married    Spouse name: Not on file   Number of children: 1   Years of education: Not on file   Highest education level: Not on file  Occupational History   Occupation: homemaker  Tobacco Use   Smoking status: Never   Smokeless tobacco: Never  Vaping Use   Vaping status: Never Used  Substance and Sexual Activity   Alcohol use: No   Drug use: No   Sexual activity: Yes    Partners: Male    Birth control/protection: Pill  Other Topics Concern   Not on file  Social History Narrative   Married, no pets.   Son graduated from [employer] at [place], start [employer] fall 2020, graduated 05/2021.   Son works for [employer], lives in [place].      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)      Updated 04/2022   Social Drivers of Health   Financial Resource Strain: Not on file  Food Insecurity: Not on file  Transportation Needs: Not on file  Physical Activity: Sufficiently Active (11/06/2016)   Exercise Vital Sign    Days of Exercise per Week: 4 days    Minutes of Exercise per Session: 60 min  Stress: No Stress Concern Present (11/06/2016)   [employer] of Occupational Health - Occupational Stress Questionnaire    Feeling of Stress : Not at all  Social Connections: Unknown (11/06/2016)   Social Connection and Isolation Panel [NHANES]    Frequency of Communication with Friends and Family: Not on file    Frequency of Social Gatherings with Friends and Family: Not on file    Attends Religious Services: Not on file    Active Member of Clubs or Organizations: Not on file    Attends Banker Meetings: Not on file    Marital Status: Married  Intimate Partner Violence: Not on file    Review of  Systems: All other review of systems negative except as mentioned in the HPI.  Physical Exam:  Vital signs BP 119/79   Pulse 62   Temp 97.9 F (36.6 C)   Resp 12   Ht 6' (1.829 m)   Wt 186 lb (84.4 kg)   LMP 12/09/2022   SpO2 100%   BMI 25.23 kg/m   General:   Alert,  Well-developed, pleasant and cooperative in NAD Lungs:  Clear throughout to auscultation.   Heart:  Regular rate and rhythm Abdomen:  Soft, nontender and nondistended.   Neuro/Psych:  Alert and cooperative. Normal mood and affect. A and O x 3  Harlin Rain, MD [facility]

## 2023-04-10 NOTE — Progress Notes (Signed)
 Called to room to assist during endoscopic procedure.  Patient ID and intended procedure confirmed with present staff. Received instructions for my participation in the procedure from the performing physician.

## 2023-04-10 NOTE — Progress Notes (Signed)
 A/o x 3, VSS, gd SR's, pleased with anesthesia, report to RN

## 2023-04-10 NOTE — Patient Instructions (Signed)
 Please read handouts provided. Continue present medications. Resume previous diet. Await pathology results.   YOU HAD AN ENDOSCOPIC PROCEDURE TODAY AT THE [facility]:   Refer to the procedure report that was given to you for any specific questions about what was found during the examination.  If the procedure report does not answer your questions, please call your gastroenterologist to clarify.  If you requested that your care partner not be given the details of your procedure findings, then the procedure report has been included in a sealed envelope for you to review at your convenience later.  YOU SHOULD EXPECT: Some feelings of bloating in the abdomen. Passage of more gas than usual.  Walking can help get rid of the air that was put into your GI tract during the procedure and reduce the bloating. If you had a lower endoscopy (such as a colonoscopy or flexible sigmoidoscopy) you may notice spotting of blood in your stool or on the toilet paper. If you underwent a bowel prep for your procedure, you may not have a normal bowel movement for a few days.  Please Note:  You might notice some irritation and congestion in your nose or some drainage.  This is from the oxygen used during your procedure.  There is no need for concern and it should clear up in a day or so.  SYMPTOMS TO REPORT IMMEDIATELY:  Following lower endoscopy (colonoscopy or flexible sigmoidoscopy):  Excessive amounts of blood in the stool  Significant tenderness or worsening of abdominal pains  Swelling of the abdomen that is new, acute  Fever of 100F or higher.  For urgent or emergent issues, a gastroenterologist can be reached at any hour by calling [phone]. Do not use MyChart messaging for urgent concerns.    DIET:  We do recommend a small meal at first, but then you may proceed to your regular diet.  Drink plenty of fluids but you should avoid alcoholic beverages for 24 hours.  ACTIVITY:  You should  plan to take it easy for the rest of today and you should NOT DRIVE or use heavy machinery until tomorrow (because of the sedation medicines used during the test).    FOLLOW UP: Our staff will call the number listed on your records the next business day following your procedure.  We will call around 7:15- 8:00 am to check on you and address any questions or concerns that you may have regarding the information given to you following your procedure. If we do not reach you, we will leave a message.     If any biopsies were taken you will be contacted by phone or by letter within the next 1-3 weeks.  Please call us at [phone] if you have not heard about the biopsies in 3 weeks.    SIGNATURES/CONFIDENTIALITY: You and/or your care partner have signed paperwork which will be entered into your electronic medical record.  These signatures attest to the fact that that the information above on your After Visit Summary has been reviewed and is understood.  Full responsibility of the confidentiality of this discharge information lies with you and/or your care-partner.

## 2023-04-10 NOTE — Progress Notes (Signed)
 Pt's states no medical or surgical changes since previsit or office visit.

## 2023-04-10 NOTE — Op Note (Signed)
 [facility] Patient Name: [name] Procedure Date: 04/10/2023 10:43 AM MRN: [id number] Endoscopist: Viviann Spare P. Adela Lank , MD, [id number] Age: 58 Referring MD:  Date of Birth: March 17, 1965 Gender: Female Account #: [id number] Procedure:                Colonoscopy Indications:              High risk colon cancer surveillance: Personal                            history of colonic polyps - 5 polyps removed                            04/2020, including an advanced adenoma Medicines:                Monitored Anesthesia Care Procedure:                Pre-Anesthesia Assessment:                           - Prior to the procedure, a History and Physical                            was performed, and patient medications and                            allergies were reviewed. The patient's tolerance of                            previous anesthesia was also reviewed. The risks                            and benefits of the procedure and the sedation                            options and risks were discussed with the patient.                            All questions were answered, and informed consent                            was obtained. Prior Anticoagulants: The patient has                            taken no anticoagulant or antiplatelet agents. ASA                            Grade Assessment: II - A patient with mild systemic                            disease. After reviewing the risks and benefits,                            the patient was deemed in satisfactory condition to  undergo the procedure.                           After obtaining informed consent, the colonoscope                            was passed under direct vision. Throughout the                            procedure, the patient's blood pressure, pulse, and                            oxygen saturations were monitored continuously. The                            Olympus Scope [id number] was  introduced through the                            anus and advanced to the the cecum, identified by                            appendiceal orifice and ileocecal valve. The                            colonoscopy was performed without difficulty. The                            patient tolerated the procedure well. The quality                            of the bowel preparation was adequate. The                            ileocecal valve, appendiceal orifice, and rectum                            were photographed. Scope In: 11:02:39 AM Scope Out: 11:19:46 AM Scope Withdrawal Time: 0 hours 13 minutes 22 seconds  Total Procedure Duration: 0 hours 17 minutes 7 seconds  Findings:                 The perianal and digital rectal examinations were                            normal.                           A 3 to 4 mm polyp was found in the ileocecal valve.                            The polyp was sessile. The polyp was removed with a                            cold snare. Resection and retrieval were complete.  A few small-mouthed diverticula were found in the                            sigmoid colon.                           A tattoo was seen in the sigmoid colon. No evidence                            of residual polyp tissue.                           The exam was otherwise without abnormality. Complications:            No immediate complications. Estimated blood loss:                            Minimal. Estimated Blood Loss:     Estimated blood loss was minimal. Impression:               - One 3 to 4 mm polyp at the ileocecal valve,                            removed with a cold snare. Resected and retrieved.                           - Diverticulosis in the sigmoid colon.                           - A tattoo was seen in the sigmoid colon.                           - The examination was otherwise normal. Recommendation:           - Patient has a contact number  available for                            emergencies. The signs and symptoms of potential                            delayed complications were discussed with the                            patient. Return to normal activities tomorrow.                            Written discharge instructions were provided to the                            patient.                           - Resume previous diet.                           - Continue present medications.                           -  Await pathology results. Anticipate repeat                            colonoscopy in 5 years given advanced adenoma                            removed in 04/2020. Viviann Spare P. Zayvien Canning, MD 04/10/2023 11:25:16 AM This report has been signed electronically.

## 2023-04-11 ENCOUNTER — Telehealth: Payer: Self-pay

## 2023-04-11 NOTE — Telephone Encounter (Signed)
  Follow up Call-     04/10/2023   10:31 AM  Call back number  Post procedure Call Back phone  # [phone]  Permission to leave phone message Yes     Patient questions:  Do you have a fever, pain , or abdominal swelling? No. Pain Score  0 *  Have you tolerated food without any problems? Yes.    Have you been able to return to your normal activities? Yes.    Do you have any questions about your discharge instructions: Diet   No. Medications  No. Follow up visit  No.  Do you have questions or concerns about your Care? No.  Actions: * If pain score is 4 or above: No action needed, pain <4.

## 2023-04-13 LAB — SURGICAL PATHOLOGY

## 2023-04-18 ENCOUNTER — Other Ambulatory Visit: Payer: Self-pay | Admitting: Family Medicine

## 2023-04-18 DIAGNOSIS — I10 Essential (primary) hypertension: Secondary | ICD-10-CM

## 2023-04-18 MED ORDER — AMLODIPINE BESYLATE 2.5 MG PO TABS: 2.5000 mg | ORAL_TABLET | Freq: Every day | ORAL | 0 refills | Status: DC

## 2023-04-18 NOTE — Telephone Encounter (Signed)
 Copied from CRM [id number]. Topic: Clinical - Medication Refill >> Apr 18, 2023  9:51 AM Loreda Rodriguez T wrote: Most Recent Primary Care Visit:  Provider: Monnie Anthony, EVE  Department: [facility]  Visit Type: ACUTE  Date: 07/26/2022  Medication: amLODipine (NORVASC) 2.5 MG tablet  Has the patient contacted their pharmacy? Yes  Is this the correct pharmacy for this prescription? Yes If no, delete pharmacy and type the correct one.  This is the patient's preferred pharmacy:  [id number] [place], [place] - [address] [address] Phone: [phone] Fax: [phone]  Has the prescription been filled recently? Yes  Is the patient out of the medication? Yes  Has the patient been seen for an appointment in the last year OR does the patient have an upcoming appointment? Yes  Can we respond through MyChart? No  Agent: Please be advised that Rx refills may take up to 3 business days. We ask that you follow-up with your pharmacy.

## 2023-04-18 NOTE — Telephone Encounter (Signed)
 Pt has an appt next week but only has 2 pills left. Needs a refill on this

## 2023-04-18 NOTE — Telephone Encounter (Signed)
 Copied from CRM [id number]. Topic: Clinical - Medication Refill >> Apr 18, 2023  9:32 AM Everette C wrote: Most Recent Primary Care Visit:  Provider: Roosvelt Colla  Department: [facility]  Visit Type: ACUTE  Date: 07/26/2022  Medication: amLODipine (NORVASC) 2.5 MG tablet [[id number]]  Has the patient contacted their pharmacy? Yes (Agent: If no, request that the patient contact the pharmacy for the refill. If patient does not wish to contact the pharmacy document the reason why and proceed with request.) (Agent: If yes, when and what did the pharmacy advise?)  Is this the correct pharmacy for this prescription? Yes If no, delete pharmacy and type the correct one.  This is the patient's preferred pharmacy:  [id number] [place], [place] - [address] [address] Phone: [phone] Fax: [phone]  Has the prescription been filled recently? Yes  Is the patient out of the medication? Yes  Has the patient been seen for an appointment in the last year OR does the patient have an upcoming appointment? No  Can we respond through MyChart? No  Agent: Please be advised that Rx refills may take up to 3 business days. We ask that you follow-up with your pharmacy.   [place] with [facility]

## 2023-04-20 ENCOUNTER — Encounter: Payer: Self-pay | Admitting: Gastroenterology

## 2023-04-23 ENCOUNTER — Other Ambulatory Visit: Payer: Self-pay | Admitting: Obstetrics and Gynecology

## 2023-04-23 DIAGNOSIS — N921 Excessive and frequent menstruation with irregular cycle: Secondary | ICD-10-CM

## 2023-04-23 NOTE — Telephone Encounter (Signed)
 Med refill request:[name]  Last AEX: 02/01/23 Next AEX: not scheduled  Last MMG (if hormonal med) 04/20/22 birads cat 1 neg  Refill authorized: last rx 01/25/23 #[phone] with 0 refills. Please approve or deny

## 2023-04-24 NOTE — Progress Notes (Unsigned)
 No chief complaint on file.  [name] is a 58 y.o. female who presents for a complete physical.    She was last seen in July with a flare of GERD. It wasn't controlled with Pepcid BID. She was advised to take a 2 week course of PPI (OTC or double strength if needed), then tapering back to BID pepcid, then to once daily, if able.  She also had allergies and PND flaring, contributing to some of her throat-clearing.  Today she reports. ?doing well on Pepcid once daily prn. Known triggers are tomato sauces. She denies dysphagia.   She remains under care of GYN (now Dr. Tia Flowers), with known fibroids and breakthrough bleeding. She has had benign EMB in 02/2023. She continues on progesterone-only pill. Had yearly GYN visit in January.  Hypertension:  She is compliant with taking amlodipine  2.5mg  daily.  BP's aren't checked very frequently, recalls they were all fine, and has been normal at other doctors, and at the dentist.. Monitor was verified as accurate in 12/2018.  She continues to walk regularly (on the job, at work, not aerobic), and denies chest pain, shortness of breath, palpitations. Denies any dizziness. BP Readings from Last 3 Encounters:  04/10/23 117/74  02/15/23 120/80  01/05/23 128/88   Mild OSA: She has previously complained of fatigue.  Sleep study was done 05/2017 (due to snoring and unrefreshed seep). Study showed mild OSA (AHI 7.9).  We had discussed treatment options, and recommended talking to her dentist about oral appliance. Dr. Abel Abelson wasn't on her insurance. Finally found someone to see to pursue the oral appliance, but then offices shut down due to COVID-19.  She talked about it with her dentist.  She decided to see ENT to see if there was anything else.  She saw Dr. Darlin Ehrlich, and was told she has deviated septum and enlarged turbinates, 95% blockage of nasal passage. He recommended septoplasty and turbinate reduction.  She hadn't pursued anything yet.  She  continues to have some difficulty with breathing through her nose, fluctuates with her allergies, not too bothersome to her. She generally feels refreshed when she wakes up (when she didn't have trouble sleeping).  She is no longer aware of herself waking up snorting.  She tries to lay on her side. Denies daytime somnolence. She still may drift off to sleep easily during the day when reading, or with watching TV, but that is often later at night, not daily.   Allergies:  dust, dog, ragweed.  She takes Allegra daily.  She has constant nasal congestion. Denies sinus pain.  History of kidney stone:  She tries to stay well hydrated and hasn't had any further problems. It was a Calcium Oxalate stone. She stopped using Tums for heartburn, uses pepcid prn instead.   H/o Vitamin D  deficiency:  Had a level of 15 in 02/2014; had gone up to 27 when taking 1000 IU daily. Level was 38.3 in 04/2021 when taking 2000 IU daily.   Most recent level was low at 22, when checked by her GYN in 01/2023. She was advised to take 4000 IU daily.  **what was she taking before?***  She is currently taking ***   Component Ref Range & Units (hover) 3 mo ago 2 yr ago 4 yr ago 6 yr ago 8 yr ago 9 yr ago 10 yr ago  Vit D, 25-Hydroxy 22 Low  38.3 R, CM 33.1 R, CM 39 CM 27 Low  CM 15 Low  CM 29 Low  R, CM     Immunization History  Administered Date(s) Administered   Influenza Split 09/02/2012   Influenza,inj,Quad PF,6+ Mos 10/02/2013, 09/09/2015, 09/21/2016, 09/26/2017, 08/15/2018, 08/29/2019, 10/06/2020   Influenza-Unspecified 11/03/2014, 10/03/2021   PFIZER Comirnaty(Gray Top)Covid-19 Tri-Sucrose Vaccine 04/07/2020   PFIZER(Purple Top)SARS-COV-2 Vaccination 03/12/2019, 04/02/2019   Pfizer Covid-19 Vaccine Bivalent Booster 53yrs & up 10/06/2020   Pfizer(Comirnaty)Fall Seasonal Vaccine 12 years and older 10/03/2021   Td 03/03/2015   Zoster Recombinant(Shingrix ) 05/04/2022, 07/26/2022   Last Pap smear: 01/2023, normal, no  high risk HPV Last mammogram: 04/2022, scheduled for later this week Last colonoscopy: 04/2023 Dr. General Kenner; SSP found, 5 year f/u recommended Last DEXA: never Dentist: twice yearly Ophtho: every other year Exercise:   walks 4-7 miles on the job, over an 8 hour day (per phone, doesn't indicate if there is any active minutes). Walking is all <10 minutes at a time. Not currently walking outside of work much, does some in the nicer weather. She eats yogurt daily; she occasionally has milk.  Lipids: Lab Results  Component Value Date   CHOL 167 04/20/2022   HDL 52 04/20/2022   LDLCALC 99 04/20/2022   TRIG 87 04/20/2022   CHOLHDL 3.2 04/20/2022     PMH, PSH, SH and FH were reviewed and updated     ROS: The patient denies anorexia, fever, vision changes, decreased hearing, ear pain, sore throat, breast concerns, palpitations, dizziness, syncope, dyspnea on exertion, cough, swelling, nausea, vomiting, diarrhea, constipation, abdominal pain, melena, hematochezia, hematuria, incontinence, dysuria, vaginal discharge, odor or itch, genital lesions, numbness, tingling, weakness, tremor, suspicious skin lesions, depression, anxiety, abnormal bleeding/bruising, or enlarged lymph nodes.   Occasional bilateral knee pain, when walking up steep hills, no recent flares. Raynaud's --mainly in winter, less frequent/mild.   Chronic nasal congestion/allergies Rare headache, related to clenching teeth Reflux per HPI Very rare stress incontinence with sneeze.   Abnormal vaginal bleeding?? No hot flashes or night sweats. She sees dermatologist regularly Occasional trouble sleeping Some swelling at just R ankle only when she sits in certain chairs, also slight tingling occurs at same time.    PHYSICAL EXAM:  LMP 12/09/2022   Wt Readings from Last 3 Encounters:  04/10/23 186 lb (84.4 kg)  03/23/23 186 lb (84.4 kg)  01/05/23 186 lb (84.4 kg)    General Appearance:    Alert, cooperative, no  distress, appears stated age     Head:    Normocephalic, without obvious abnormality, atraumatic     Eyes:    PERRL, conjunctiva/corneas clear, EOM's intact, fundi benign     Ears:    Cerumen partially obstructing views of TM's bilaterally, L>R. Visualized portions are normal ***  Nose:    Turbinates with mild congestion bilaterally, some septal deviation. Sinuses nontender. No erythema or purulence  Throat:    Normal mucosa, no lesions  Neck:    Supple, no lymphadenopathy; thyroid: no enlargement/tenderness/nodules; no carotid bruit or JVD     Back:    Spine nontender, no curvature, ROM normal, no CVA tenderness     Lungs:    Clear to auscultation bilaterally without wheezes, rales or ronchi; respirations unlabored     Chest Wall:    No tenderness or deformity     Heart:    Regular rate and rhythm, S1 and S2 normal, no murmur, rub or gallop     Breast Exam:    Deferred to GYN     Abdomen:    Soft, non-tender, nondistended, normoactive bowel sounds, no masses,  no hepatosplenomegaly     Genitalia:    Deferred to GYN            Extremities:    No clubbing, cyanosis or edema     Pulses:    2+ and symmetric all extremities     Skin:    Skin color, texture, turgor normal, no lesions.   Lymph nodes:    Cervical, supraclavicular nodes normal     Neurologic:    Cranial nerves grossly intact. Normal strength, sensation and gait; reflexes 2+ and symmetric throughout. Alert and oriented                    Psych:    Normal mood, affect, hygiene and grooming    ***UPDATE EARS--cerumen L>R???  ASSESSMENT/PLAN:  Did she get flu or COVID booster in 2024? Prevnar-20  Vitamin D  to recheck (was low in January) Consider CBC if having any vaginal bleeding B-met due to HTN. Lipids fine last year.   Discussed monthly self breast exams and yearly mammograms; at least 30 minutes of aerobic activity at least 5 days/week, weight-bearing exercise at least 2x/wk; proper sunscreen use reviewed; healthy diet,  including goals of calcium and vitamin D  intake and alcohol recommendations (less than or equal to 1 drink/day) reviewed; regular seatbelt use; changing batteries in smoke detectors.  Immunization recommendations discussed-- Continue yearly flu shots.  Prevnar-20 *** Colon cancer screening--UTD.   F/u 1 year, sooner prn.

## 2023-04-24 NOTE — Patient Instructions (Incomplete)
  HEALTH MAINTENANCE RECOMMENDATIONS:  It is recommended that you get at least 30 minutes of aerobic exercise at least 5 days/week (for weight loss, you may need as much as 60-90 minutes). This can be any activity that gets your heart rate up. This can be divided in 10-15 minute intervals if needed, but try and build up your endurance at least once a week.  Weight bearing exercise is also recommended twice weekly.  Eat a healthy diet with lots of vegetables, fruits and fiber.  "Colorful" foods have a lot of vitamins (ie green vegetables, tomatoes, red peppers, etc).  Limit sweet tea, regular sodas and alcoholic beverages, all of which has a lot of calories and sugar.  Up to 1 alcoholic drink daily may be beneficial for women (unless trying to lose weight, watch sugars).  Drink a lot of water.  Calcium recommendations are 1200-1500 mg daily (1500 mg for postmenopausal women or women without ovaries), and vitamin D  1000 IU daily.  This should be obtained from diet and/or supplements (vitamins), and calcium should not be taken all at once, but in divided doses.  Monthly self breast exams and yearly mammograms for women over the age of 61 is recommended.  Sunscreen of at least SPF 30 should be used on all sun-exposed parts of the skin when outside between the hours of 10 am and 4 pm (not just when at beach or pool, but even with exercise, golf, tennis, and yard work!)  Use a sunscreen that says "broad spectrum" so it covers both UVA and UVB rays, and make sure to reapply every 1-2 hours.  Remember to change the batteries in your smoke detectors when changing your clock times in the spring and fall. Carbon monoxide detectors are recommended for your home.  Use your seat belt every time you are in a car, and please drive safely and not be distracted with cell phones and texting while driving.  Please discuss your concerns with your GYN.  If she doesn't have a particular reason why you should stay on the  progesterone-only pill, I suggest a trial off.  (It is typically used for contraception; unclear if you are getting any benefit from the hormone, and ?if this could have any effect on spotting). If you start to have more regular cycles while off the pill, I would recommend recheck [facility], and if high, then you need further treatment for your postmenopausal bleeding.  If the repeat FSH is low, than you may not truly be POSTmenopausal.  Consider adding Flonase  or switching antihistaimnes to see if that helps with allergies.   You can also try mucinex for postnasal drainage that is thick and causing cough.

## 2023-04-26 ENCOUNTER — Encounter: Payer: Self-pay | Admitting: Family Medicine

## 2023-04-26 ENCOUNTER — Ambulatory Visit (INDEPENDENT_AMBULATORY_CARE_PROVIDER_SITE_OTHER): Payer: 59 | Admitting: Family Medicine

## 2023-04-26 ENCOUNTER — Ambulatory Visit
Admission: RE | Admit: 2023-04-26 | Discharge: 2023-04-26 | Disposition: A | Discharge status: Home / Self Care | Payer: 59 | Source: Ambulatory Visit

## 2023-04-26 VITALS — BP 122/78 | HR 72 | Ht 72.05 in | Wt 183.8 lb

## 2023-04-26 DIAGNOSIS — K219 Gastro-esophageal reflux disease without esophagitis: Secondary | ICD-10-CM

## 2023-04-26 DIAGNOSIS — G4733 Obstructive sleep apnea (adult) (pediatric): Secondary | ICD-10-CM

## 2023-04-26 DIAGNOSIS — J309 Allergic rhinitis, unspecified: Secondary | ICD-10-CM

## 2023-04-26 DIAGNOSIS — Z23 Encounter for immunization: Secondary | ICD-10-CM

## 2023-04-26 DIAGNOSIS — I10 Essential (primary) hypertension: Secondary | ICD-10-CM

## 2023-04-26 DIAGNOSIS — E559 Vitamin D deficiency, unspecified: Secondary | ICD-10-CM

## 2023-04-26 DIAGNOSIS — N939 Abnormal uterine and vaginal bleeding, unspecified: Secondary | ICD-10-CM | POA: Diagnosis not present

## 2023-04-26 DIAGNOSIS — Z1231 Encounter for screening mammogram for malignant neoplasm of breast: Secondary | ICD-10-CM

## 2023-04-26 DIAGNOSIS — Z0000 Encounter for general adult medical examination without abnormal findings: Secondary | ICD-10-CM | POA: Diagnosis not present

## 2023-04-26 MED ORDER — AMLODIPINE BESYLATE 2.5 MG PO TABS: 2.5000 mg | ORAL_TABLET | Freq: Every day | ORAL | 0 refills | Status: DC

## 2023-04-26 NOTE — Addendum Note (Signed)
 Addended by: Roosvelt Colla on: 04/26/2023 08:34 PM   Modules accepted: Level of Service

## 2023-04-27 ENCOUNTER — Encounter: Payer: Self-pay | Admitting: Family Medicine

## 2023-04-27 LAB — CBC WITH DIFFERENTIAL/PLATELET
Basophils Absolute: 0 10*3/uL (ref 0.0–0.2)
Basos: 0 %
EOS (ABSOLUTE): 0.2 10*3/uL (ref 0.0–0.4)
Eos: 4 %
Hematocrit: 43.2 % (ref 34.0–46.6)
Hemoglobin: 14.3 g/dL (ref 11.1–15.9)
Immature Grans (Abs): 0 10*3/uL (ref 0.0–0.1)
Immature Granulocytes: 0 %
Lymphocytes Absolute: 1 10*3/uL (ref 0.7–3.1)
Lymphs: 15 %
MCH: 29.2 pg (ref 26.6–33.0)
MCHC: 33.1 g/dL (ref 31.5–35.7)
MCV: 88 fL (ref 79–97)
Monocytes Absolute: 0.7 10*3/uL (ref 0.1–0.9)
Monocytes: 10 %
Neutrophils Absolute: 4.8 10*3/uL (ref 1.4–7.0)
Neutrophils: 71 %
Platelets: 277 10*3/uL (ref 150–450)
RBC: 4.89 x10E6/uL (ref 3.77–5.28)
RDW: 11.9 % (ref 11.7–15.4)
WBC: 6.8 10*3/uL (ref 3.4–10.8)

## 2023-04-27 LAB — LIPID PANEL
Chol/HDL Ratio: 3 ratio (ref 0.0–4.4)
Cholesterol, Total: 185 mg/dL (ref 100–199)
HDL: 61 mg/dL (ref >39)
LDL Chol Calc (NIH): 111 mg/dL — ABNORMAL HIGH (ref 0–99)
Triglycerides: 68 mg/dL (ref 0–149)
VLDL Cholesterol Cal: 13 mg/dL (ref 5–40)

## 2023-04-27 LAB — BASIC METABOLIC PANEL WITH GFR
BUN/Creatinine Ratio: 15 (ref 9–23)
BUN: 12 mg/dL (ref 6–24)
CO2: 25 mmol/L (ref 20–29)
Calcium: 9.4 mg/dL (ref 8.7–10.2)
Chloride: 102 mmol/L (ref 96–106)
Creatinine, Ser: 0.79 mg/dL (ref 0.57–1.00)
Glucose: 90 mg/dL (ref 70–99)
Potassium: 4.2 mmol/L (ref 3.5–5.2)
Sodium: 139 mmol/L (ref 134–144)
eGFR: 88 mL/min/{1.73_m2} (ref >59)

## 2023-04-27 LAB — VITAMIN D 25 HYDROXY (VIT D DEFICIENCY, FRACTURES): Vit D, 25-Hydroxy: 38.1 ng/mL (ref 30.0–100.0)

## 2023-06-09 ENCOUNTER — Other Ambulatory Visit: Payer: Self-pay | Admitting: Family Medicine

## 2023-06-09 DIAGNOSIS — I10 Essential (primary) hypertension: Secondary | ICD-10-CM

## 2023-07-14 ENCOUNTER — Other Ambulatory Visit: Payer: Self-pay | Admitting: Obstetrics and Gynecology

## 2023-07-14 DIAGNOSIS — N921 Excessive and frequent menstruation with irregular cycle: Secondary | ICD-10-CM

## 2023-07-16 NOTE — Telephone Encounter (Signed)
 Medication refill request: jencycla  0.35mg  Last AEX:  01-05-23 Next AEX: not scheduled Last MMG (if hormonal medication request): 04-26-23 birads 1:neg Refill authorized: please approve if appropriate

## 2023-10-02 ENCOUNTER — Encounter: Payer: Self-pay | Admitting: Obstetrics and Gynecology

## 2023-10-02 ENCOUNTER — Ambulatory Visit (INDEPENDENT_AMBULATORY_CARE_PROVIDER_SITE_OTHER): Admitting: Obstetrics and Gynecology

## 2023-10-02 VITALS — BP 104/70 | HR 89 | Wt 192.6 lb

## 2023-10-02 DIAGNOSIS — N898 Other specified noninflammatory disorders of vagina: Secondary | ICD-10-CM

## 2023-10-02 DIAGNOSIS — E2839 Other primary ovarian failure: Secondary | ICD-10-CM | POA: Diagnosis not present

## 2023-10-02 DIAGNOSIS — D219 Benign neoplasm of connective and other soft tissue, unspecified: Secondary | ICD-10-CM | POA: Diagnosis not present

## 2023-10-02 LAB — URINALYSIS, COMPLETE W/RFL CULTURE
Bacteria, UA: NONE SEEN /HPF
Bilirubin Urine: NEGATIVE
Glucose, UA: NEGATIVE
Hgb urine dipstick: NEGATIVE
Hyaline Cast: NONE SEEN /LPF
Ketones, ur: NEGATIVE
Leukocyte Esterase: NEGATIVE
Nitrites, Initial: NEGATIVE
Protein, ur: NEGATIVE
RBC / HPF: NONE SEEN /HPF (ref 0–2)
Specific Gravity, Urine: 1.02 (ref 1.001–1.035)
WBC, UA: NONE SEEN /HPF (ref 0–5)
pH: 7 (ref 5.0–8.0)

## 2023-10-02 LAB — NO CULTURE INDICATED

## 2023-10-02 NOTE — Progress Notes (Unsigned)
   Acute Office Visit  Subjective:    Patient ID: [name], female    DOB: 28-Sep-1965, 58 y.o.   MRN: [id number]   HPI 58 y.o. presents today for Gynecologic Exam (Vaginal discharge since Thursday. Starting to burn. No odor, no color, itching. ) .No VB since February and after EMB. Having some occasional brown and this past time was clear Has family history of ovarian cancer EMB 02/17/22 benign pathology  Patient's last menstrual period was 12/09/2022.    Review of Systems     Objective:    OBGyn Exam  BP 104/70   Pulse 89   Wt 192 lb 9.6 oz (87.4 kg)   LMP 12/09/2022   SpO2 97%   BMI 26.09 kg/m  Wt Readings from Last 3 Encounters:  10/02/23 192 lb 9.6 oz (87.4 kg)  04/26/23 183 lb 12.8 oz (83.4 kg)  04/10/23 186 lb (84.4 kg)          Component Value Date/Time   DIAGPAP  01/05/2023 1153    - Negative for intraepithelial lesion or malignancy (NILM)   DIAGPAP  05/18/2020 1113    - Negative for intraepithelial lesion or malignancy (NILM)   HPVHIGH Negative 01/05/2023 1153   HPVHIGH Negative 05/18/2020 1113   ADEQPAP  01/05/2023 1153    Satisfactory for evaluation. The presence or absence of an   ADEQPAP  01/05/2023 1153    endocervical/transformation zone component cannot be determined because   ADEQPAP of atrophy. 01/05/2023 1153   [name], CMA was present for the exam   Assessment & Plan:  Vaginal discharge Nu swab sent. To notify patient of the results Not bothered by menopausal symptoms and having very little at this time. Referral for dxa baseline  Dr. Glennon Almarie MARLA Glennon

## 2023-10-03 ENCOUNTER — Ambulatory Visit: Payer: Self-pay | Admitting: Obstetrics and Gynecology

## 2023-10-03 LAB — SURESWAB® ADVANCED VAGINITIS PLUS,TMA
C. trachomatis RNA, TMA: NOT DETECTED
CANDIDA SPECIES: NOT DETECTED
Candida glabrata: NOT DETECTED
N. gonorrhoeae RNA, TMA: NOT DETECTED
SURESWAB(R) ADV BACTERIAL VAGINOSIS(BV),TMA: NEGATIVE
TRICHOMONAS VAGINALIS (TV),TMA: NOT DETECTED

## 2023-10-12 ENCOUNTER — Telehealth (HOSPITAL_BASED_OUTPATIENT_CLINIC_OR_DEPARTMENT_OTHER): Payer: Self-pay

## 2024-05-22 ENCOUNTER — Encounter: Payer: Self-pay | Admitting: Family Medicine
